# Patient Record
Sex: Male | Born: 1940 | Hispanic: No | Smoking: Former smoker
Health system: Southern US, Community
[De-identification: ages and names within clinical notes are randomized; demographics above are authoritative.]

## PROBLEM LIST (undated history)

## (undated) DIAGNOSIS — R32 Unspecified urinary incontinence: Secondary | ICD-10-CM

## (undated) DIAGNOSIS — I509 Heart failure, unspecified: Secondary | ICD-10-CM

## (undated) DIAGNOSIS — M3214 Glomerular disease in systemic lupus erythematosus: Secondary | ICD-10-CM

## (undated) DIAGNOSIS — F0390 Unspecified dementia without behavioral disturbance: Secondary | ICD-10-CM

## (undated) DIAGNOSIS — E785 Hyperlipidemia, unspecified: Secondary | ICD-10-CM

## (undated) DIAGNOSIS — I10 Essential (primary) hypertension: Secondary | ICD-10-CM

## (undated) DIAGNOSIS — K4090 Unilateral inguinal hernia, without obstruction or gangrene, not specified as recurrent: Secondary | ICD-10-CM

## (undated) DIAGNOSIS — S4990XA Unspecified injury of shoulder and upper arm, unspecified arm, initial encounter: Secondary | ICD-10-CM

## (undated) DIAGNOSIS — D649 Anemia, unspecified: Secondary | ICD-10-CM

## (undated) DIAGNOSIS — J439 Emphysema, unspecified: Secondary | ICD-10-CM

## (undated) DIAGNOSIS — Z7409 Other reduced mobility: Secondary | ICD-10-CM

## (undated) HISTORY — PX: HERNIA REPAIR: SHX51

## (undated) HISTORY — PX: CATARACT EXTRACTION: SUR2

## (undated) HISTORY — DX: Unilateral inguinal hernia, without obstruction or gangrene, not specified as recurrent: K40.90

---

## 1997-09-10 ENCOUNTER — Ambulatory Visit (HOSPITAL_COMMUNITY): Admission: RE | Admit: 1997-09-10 | Discharge: 1997-09-10 | Payer: Self-pay | Admitting: Orthopedic Surgery

## 1997-10-21 ENCOUNTER — Encounter: Admission: RE | Admit: 1997-10-21 | Discharge: 1998-01-19 | Payer: Self-pay | Admitting: Anesthesiology

## 1997-11-24 ENCOUNTER — Inpatient Hospital Stay (HOSPITAL_COMMUNITY): Admission: AD | Admit: 1997-11-24 | Discharge: 1997-11-25 | Payer: Self-pay | Admitting: Internal Medicine

## 1997-11-25 ENCOUNTER — Encounter: Payer: Self-pay | Admitting: Internal Medicine

## 2004-10-05 ENCOUNTER — Ambulatory Visit: Payer: Self-pay | Admitting: Internal Medicine

## 2004-10-13 ENCOUNTER — Ambulatory Visit: Payer: Self-pay | Admitting: Internal Medicine

## 2004-10-24 ENCOUNTER — Ambulatory Visit: Payer: Self-pay | Admitting: Internal Medicine

## 2004-11-01 ENCOUNTER — Ambulatory Visit: Payer: Self-pay

## 2004-11-02 ENCOUNTER — Ambulatory Visit: Payer: Self-pay | Admitting: Internal Medicine

## 2004-11-23 ENCOUNTER — Ambulatory Visit: Payer: Self-pay | Admitting: Internal Medicine

## 2004-11-30 ENCOUNTER — Ambulatory Visit: Payer: Self-pay | Admitting: Cardiology

## 2004-12-06 ENCOUNTER — Ambulatory Visit: Payer: Self-pay

## 2004-12-23 ENCOUNTER — Ambulatory Visit: Payer: Self-pay | Admitting: Cardiology

## 2005-01-05 ENCOUNTER — Ambulatory Visit: Payer: Self-pay | Admitting: Internal Medicine

## 2005-01-06 ENCOUNTER — Ambulatory Visit: Payer: Self-pay | Admitting: Cardiology

## 2005-01-25 ENCOUNTER — Ambulatory Visit: Payer: Self-pay | Admitting: Cardiology

## 2005-02-02 ENCOUNTER — Ambulatory Visit: Payer: Self-pay | Admitting: Internal Medicine

## 2005-02-16 ENCOUNTER — Ambulatory Visit: Payer: Self-pay

## 2005-02-16 ENCOUNTER — Ambulatory Visit: Payer: Self-pay | Admitting: Internal Medicine

## 2005-05-28 ENCOUNTER — Emergency Department (HOSPITAL_COMMUNITY): Admission: EM | Admit: 2005-05-28 | Discharge: 2005-05-28 | Payer: Self-pay | Admitting: Emergency Medicine

## 2005-06-18 ENCOUNTER — Encounter: Admission: RE | Admit: 2005-06-18 | Discharge: 2005-06-18 | Payer: Self-pay | Admitting: Orthopedic Surgery

## 2005-09-05 ENCOUNTER — Ambulatory Visit: Payer: Self-pay | Admitting: Cardiology

## 2006-01-11 ENCOUNTER — Encounter: Payer: Self-pay | Admitting: Cardiology

## 2006-01-11 ENCOUNTER — Ambulatory Visit: Payer: Self-pay | Admitting: Cardiology

## 2006-01-11 ENCOUNTER — Inpatient Hospital Stay (HOSPITAL_COMMUNITY): Admission: EM | Admit: 2006-01-11 | Discharge: 2006-01-11 | Payer: Self-pay | Admitting: Emergency Medicine

## 2006-02-02 ENCOUNTER — Ambulatory Visit: Payer: Self-pay | Admitting: Cardiology

## 2006-03-11 ENCOUNTER — Emergency Department (HOSPITAL_COMMUNITY): Admission: EM | Admit: 2006-03-11 | Discharge: 2006-03-11 | Payer: Self-pay | Admitting: Emergency Medicine

## 2007-02-10 ENCOUNTER — Emergency Department (HOSPITAL_COMMUNITY): Admission: EM | Admit: 2007-02-10 | Discharge: 2007-02-10 | Payer: Self-pay | Admitting: Family Medicine

## 2007-02-12 ENCOUNTER — Emergency Department (HOSPITAL_COMMUNITY): Admission: EM | Admit: 2007-02-12 | Discharge: 2007-02-13 | Payer: Self-pay | Admitting: Emergency Medicine

## 2007-04-06 ENCOUNTER — Encounter: Admission: RE | Admit: 2007-04-06 | Discharge: 2007-04-06 | Payer: Self-pay | Admitting: Otolaryngology

## 2007-12-26 ENCOUNTER — Emergency Department (HOSPITAL_COMMUNITY): Admission: EM | Admit: 2007-12-26 | Discharge: 2007-12-26 | Payer: Self-pay | Admitting: Emergency Medicine

## 2010-08-26 NOTE — H&P (Signed)
NAME:  LARRELL, RAPOZO NO.:  [id number]   MEDICAL RECORD NO.:  [id number]          PATIENT TYPE:  EMS   LOCATION:  MAJO                         FACILITY:  [facility]   PHYSICIAN:  Jonelle Sidle, MD DATE OF BIRTH:  October 25, 1940   DATE OF ADMISSION:  01/10/2006  DATE OF DISCHARGE:                                HISTORY & PHYSICAL   CHIEF COMPLAINT:  Chest pain.   Mr. Sida is a 70 year old Caucasian male with a history of COPD and  emphysema who presents with sharp mid sternal chest pain for the last nine  hours.  The patient has had a history of chest pain dating back to 1994 for  which he was successfully treated for gastroesophageal reflux disease.  12  months ago, the patient underwent nuclear stress test due to dyspnea on  exertion.  The stress test was negative.  The patient denies any chest pain  for the last several years, but has noted circumferential bilateral lower  rib pain for the last 2-3 months.  The mid sternal pain started at  approximately 3 p.m. today and has been constant for the last nine hours.  The pain started at rest after a light meal and has persisted since without  any worsening during exertion.  The patient does note a pleuritic component  describing significant pain with deep breathing.  After presenting to the  emergency room, he received sublingual nitroglycerin which promptly dropped  his blood pressure making him diaphoretic, hypotensive, and hypoxic with  sats in the 80s.  Chest CT was negative for pulmonary embolism.  The patient  also received aspirin and Solu-Medrol and subsequently noted a mild  improvement in his symptoms, but still states that his pain is 8 out of 10.  The patient states that the pain radiates slightly to the right chest.  The  patient denies any orthopnea, paroxysmal nocturnal dyspnea, or syncope.   PAST MEDICAL HISTORY:  Emphysema/asthma, hypercholesterolemia, arthritis,  and chronic low back pain,  restless leg syndrome.   ALLERGIES:  No known drug allergies.   MEDICATIONS:  Advair p.r.n., Simvastatin 20 mg a day, Melatonin p.r.n.,  Gabapentin unknown dose, and Legatrim over the counter medication for  restless leg syndrome.   SOCIAL HISTORY:  The patient lives in [place] with his wife.  He is  retired.  He quit tobacco use 15 years ago.  He denies any alcohol use.   FAMILY HISTORY:  Notable for no significant coronary artery disease or  diabetes.   REVIEW OF SYSTEMS:  Notable for chronic low back pain, mild intermittent  shortness of breath, 12 systems was reviewed and is negative.   PHYSICAL EXAMINATION:  VITAL SIGNS:  Temperature 98.7, pulse 89, respiratory rate 20, blood  pressure 105/62.  GENERAL:  The patient is awake, alert, oriented x3, in no acute distress.  HEENT:  Normocephalic and atraumatic.  Pupils equal, round, reactive to  light.  Extraocular movements intact.  NECK:  No JVD, no carotid bruits.  CARDIOVASCULAR:  Regular rhythm, normal rate, no murmurs, gallops, and rubs.  LUNGS:  Mildly decreased diffuse air movement  but no wheezing or rhonchi.  CHEST:  Chest wall demonstrated no significant reproducible tenderness to  deep palpation at the mid sternum.  ABDOMEN:  Obese, positive bowel sounds, soft, nontender, nondistended.  EXTREMITIES:  1+ distal pulses with no bruits.  NEUROLOGICAL:  Cranial nerves 2-12 grossly intact, no focal motor or sensory  deficits.  5/5 extremity strength.  SKIN:  Demonstrates no rash.  MUSCULOSKELETAL:  Demonstrates lower back tenderness to deep palpation.   EKG demonstrates normal sinus rhythm with a heart rate of 85, left axis  deviation, and no evidence of Q waves or ST segment changes.  Labs  demonstrate hemoglobin 17.3 and hematocrit 51.  BUN 11, creatinine 0.9.  CK  68, MB less than 1, troponin less than 0.05.  CT scan, preliminary results,  demonstrates no acute cardiopulmonary process.   ASSESSMENT/PLAN:  This is a  70 year old Caucasian male with emphysema and  recent negative stress test 6-12 months ago who presents with symptoms  consistent with pleuritic chest pain.  1. Chest pain.  Symptoms are atypical for acute coronary syndrome,      however, the patient will be ruled out for myocardial infarction.  We      will hold anticoagulation unless the patient rules in which is a low      probability.  The patient will receive Toradol for pain relief.  He      will be admitted to telemetry.  2. COPD.  There is no evidence for acute exacerbation with the patient      denying any worsening shortness of breath.  We will provide rapidly      tapering steroids.  3. DVT prophylaxis.  Subcu heparin 5000 units b.i.d.      Reginia Forts, MD   Electronically Signed     ______________________________  Jonelle Sidle, MD    RA/MEDQ  D:  01/11/2006  T:  01/11/2006  Job:  [id number]

## 2010-08-26 NOTE — Discharge Summary (Signed)
NAME:  [name], Charles NO.:  [id number]   MEDICAL RECORD NO.:  [id number]          PATIENT TYPE:  INP   LOCATION:  6529                         FACILITY:  [facility]   PHYSICIAN:  Dorian Pod, ACNP  DATE OF BIRTH:  1940-08-22   DATE OF ADMISSION:  01/10/2006  DATE OF DISCHARGE:  01/11/2006                                 DISCHARGE SUMMARY   DISCHARGE DIAGNOSIS:  Atypical chest pain, negative cardiac markers, EKG  without acute S-T or T-wave changes.   PAST MEDICAL HISTORY:  Past medical history includes:  1. Cardiomyopathy with an improvement to a normal ejection fraction by      echocardiogram in November of 2006.  2. History of remote tobacco use.  Patient states he has quit at this time  3. Restless leg syndrome.  4. Asthma.  5. Hypercholesterolemia.  6. COPD.  7. Arthritis.  8. Chronic back pain.   HOSPITAL COURSE:  [name] is a 70 year old Caucasian gentleman followed  by Dr. Diona Browner.  In regards to his history of cardiomyopathy, which however  has improved to normal by echocardiogram.  Patient presented to [facility]  emergency room this admission for complaint of chest discomfort.  He  described a sharp midsternal chest pain.  Apparently, [name] had a  stress test done within the last year that was negative for ischemia.  Patient received sublingual nitroglycerin and subsequently became nauseated,  hypokalemic, and hypoxic in the 80's.  CT of the chest was negative for  pulmonary embolism.  Patient states no relief with nitroglycerin.  Patient  then received Solu-Medrol IV and Toradol and was then admitted to the  telemetry unit.  Patient could not tell any improvement with the Toradol.  Cardiac markers were negative.  Prednisone was discontinued as it did not  seem to improve patient's discomfort.  Also, in the setting of hyperglycemic  episode with a glucose of 200.  Patient has no known history of diabetes.  On day of discharge, patient  described the discomfort as more of an ache in  the center of his chest.  He noticed it more after eating peanuts and chips.  This patient's cardiac markers have been negative.  Patient's rhythm has  been stable on the monitor and vital signs are stable.  Patient is being  discharged home to followup with Dr. Diona Browner on October 26th at 9:45 a.m.  I have given him a prescription for Protonix 40 mg daily.  He is instructed  to continue his previous medications including his Advair, simvastatin,  gabapentin.  He knows to call our office if the discomfort continues, if  emergency, he is to seek medical assistance at the nearest facility.  At  time of discharge, patient is afebrile, blood pressure 123/62, heart rate  78, respiration 18, patient is setting 97% on 2L, hemoglobin 17.3, potassium  4.2, BUN and creatinine 10 and 0.9.   DURATION OF DISCHARGE ENCOUNTER:  25 minutes.           ______________________________  Dorian Pod, ACNP    MB/MEDQ  D:  01/11/2006  T:  01/11/2006  Job:  [id number]

## 2010-08-26 NOTE — Assessment & Plan Note (Signed)
[facility]                              CARDIOLOGY OFFICE NOTE   DEMYAN, FUGATE                     MRN:          [id number]  DATE:02/02/2006                            DOB:          07-Feb-1941    PRIMARY CARE PHYSICIAN:  Floydene Flock, M.D.   REASON FOR VISIT:  Post hospitalization followup.   HISTORY OF PRESENT ILLNESS:  I saw Mr. Charles [name] during a recent hospital  stay in October. He had presented at that time with fairly atypical chest  pain and ruled out for myocardial infarction.  He had a CT scan of the chest  which did not reveal pulmonary embolus. It was treated briefly with steroids  and nonsteroidals and ultimately placed on a proton pump inhibitor as his  symptoms seemed perhaps more gastrointestinal in etiology.  He did have a  followup echocardiogram demonstrating stable, normal left ventricular  ejection fraction of 55-60% without regional wall motion abnormalities. He  denies any problems with limiting dyspnea on exertion beyond NYHA Class II  and he has had no chest pain, palpitations or syncope.  The EKG today showed  sinus rhythm with a leftward axis and incomplete right bundle branch block  pattern.   ALLERGIES:  No known drug allergies.   PRESENT MEDICATIONS:  1. Advair two puffs daily.  2. Nexium 40 mg p.o. daily.   REVIEW OF SYSTEMS:  As per HPI.   PHYSICAL EXAMINATION:  VITAL SIGNS:  Blood pressure 128/70, heart rate 77,  weight 198 pounds.  GENERAL:  The patient is comfortable and in no acute distress.  NECK:  No elevated jugular venous pressure without bruits.  No thyromegaly  is noted.  LUNGS:  Clear without labored breathing.  CARDIAC:  Regular rate and rhythm without S3 gallop or loud murmur.  EXTREMITIES:  No significant pitting edema.   IMPRESSION/RECOMMENDATIONS:  1. History of cardiomyopathy although with improvement with normal      ejection fraction, recently assessed in October at  55-60%.  Will      continue observation at this point.  Due to financial concerns, he has      preferred little if any medications going forward.  I will see him      back in the next six months.  2. Otherwise continue regular followup __________.     Jonelle Sidle, MD    SGM/MedQ  DD: 02/02/2006  DT: 02/03/2006  Job #: [id number]   cc:   Floydene Flock, M.D.

## 2011-01-09 LAB — POCT CARDIAC MARKERS
CKMB, poc: 1.1
CKMB, poc: <1 — ABNORMAL LOW
Myoglobin, poc: 58.4
Myoglobin, poc: 86.8
Troponin i, poc: 0.06 — ABNORMAL HIGH
Troponin i, poc: <0.05

## 2011-01-09 LAB — POCT I-STAT, CHEM 8
Calcium, Ion: 1.07 — ABNORMAL LOW
Chloride: 107
HCT: 44
Sodium: 139
TCO2: 29

## 2011-01-09 LAB — CK TOTAL AND CKMB (NOT AT ARMC): Relative Index: INVALID

## 2011-01-17 LAB — I-STAT 8, (EC8 V) (CONVERTED LAB)
BUN: 11
Chloride: 109
HCT: 46
Hemoglobin: 15.6
Sodium: 142
TCO2: 29
pCO2, Ven: 50.1 — ABNORMAL HIGH
pH, Ven: 7.353 — ABNORMAL HIGH

## 2011-01-17 LAB — CBC
HCT: 42
Hemoglobin: 13.9
Platelets: 228

## 2011-01-17 LAB — DIFFERENTIAL
Basophils Absolute: 0
Eosinophils Relative: 2
Lymphs Abs: 2.5
Monocytes Absolute: 0.6
Monocytes Relative: 9
Neutro Abs: 3.3

## 2011-01-17 LAB — POCT CARDIAC MARKERS
Operator id: 294341
Troponin i, poc: <0.05

## 2013-11-08 DIAGNOSIS — D649 Anemia, unspecified: Secondary | ICD-10-CM

## 2013-11-08 HISTORY — DX: Anemia, unspecified: D64.9

## 2013-11-08 HISTORY — PX: RENAL BIOPSY, PERCUTANEOUS: SUR144

## 2013-12-10 ENCOUNTER — Emergency Department (HOSPITAL_COMMUNITY): Payer: Medicare Other

## 2013-12-10 ENCOUNTER — Inpatient Hospital Stay (HOSPITAL_COMMUNITY)
Admission: EM | Admit: 2013-12-10 | Discharge: 2013-12-21 | DRG: 682 | Disposition: A | Discharge status: Home Health | Payer: Medicare Other | Attending: Internal Medicine | Admitting: Internal Medicine

## 2013-12-10 ENCOUNTER — Encounter (HOSPITAL_COMMUNITY): Payer: Self-pay | Admitting: Emergency Medicine

## 2013-12-10 DIAGNOSIS — T451X5A Adverse effect of antineoplastic and immunosuppressive drugs, initial encounter: Secondary | ICD-10-CM | POA: Diagnosis present

## 2013-12-10 DIAGNOSIS — IMO0002 Reserved for concepts with insufficient information to code with codable children: Secondary | ICD-10-CM

## 2013-12-10 DIAGNOSIS — N059 Unspecified nephritic syndrome with unspecified morphologic changes: Secondary | ICD-10-CM | POA: Diagnosis present

## 2013-12-10 DIAGNOSIS — E872 Acidosis, unspecified: Secondary | ICD-10-CM | POA: Diagnosis present

## 2013-12-10 DIAGNOSIS — N184 Chronic kidney disease, stage 4 (severe): Secondary | ICD-10-CM

## 2013-12-10 DIAGNOSIS — D631 Anemia in chronic kidney disease: Secondary | ICD-10-CM | POA: Diagnosis not present

## 2013-12-10 DIAGNOSIS — I120 Hypertensive chronic kidney disease with stage 5 chronic kidney disease or end stage renal disease: Secondary | ICD-10-CM | POA: Diagnosis not present

## 2013-12-10 DIAGNOSIS — N049 Nephrotic syndrome with unspecified morphologic changes: Secondary | ICD-10-CM | POA: Diagnosis present

## 2013-12-10 DIAGNOSIS — R195 Other fecal abnormalities: Secondary | ICD-10-CM | POA: Diagnosis present

## 2013-12-10 DIAGNOSIS — N186 End stage renal disease: Secondary | ICD-10-CM | POA: Diagnosis present

## 2013-12-10 DIAGNOSIS — Z87891 Personal history of nicotine dependence: Secondary | ICD-10-CM

## 2013-12-10 DIAGNOSIS — Z806 Family history of leukemia: Secondary | ICD-10-CM

## 2013-12-10 DIAGNOSIS — S32000A Wedge compression fracture of unspecified lumbar vertebra, initial encounter for closed fracture: Secondary | ICD-10-CM

## 2013-12-10 DIAGNOSIS — Z79899 Other long term (current) drug therapy: Secondary | ICD-10-CM

## 2013-12-10 DIAGNOSIS — Z794 Long term (current) use of insulin: Secondary | ICD-10-CM

## 2013-12-10 DIAGNOSIS — E1129 Type 2 diabetes mellitus with other diabetic kidney complication: Secondary | ICD-10-CM | POA: Diagnosis present

## 2013-12-10 DIAGNOSIS — E8779 Other fluid overload: Secondary | ICD-10-CM | POA: Diagnosis not present

## 2013-12-10 DIAGNOSIS — D61818 Other pancytopenia: Secondary | ICD-10-CM

## 2013-12-10 DIAGNOSIS — N039 Chronic nephritic syndrome with unspecified morphologic changes: Secondary | ICD-10-CM

## 2013-12-10 DIAGNOSIS — E1122 Type 2 diabetes mellitus with diabetic chronic kidney disease: Secondary | ICD-10-CM

## 2013-12-10 DIAGNOSIS — R627 Adult failure to thrive: Secondary | ICD-10-CM | POA: Diagnosis present

## 2013-12-10 DIAGNOSIS — E875 Hyperkalemia: Secondary | ICD-10-CM | POA: Diagnosis present

## 2013-12-10 DIAGNOSIS — W010XXA Fall on same level from slipping, tripping and stumbling without subsequent striking against object, initial encounter: Secondary | ICD-10-CM | POA: Diagnosis present

## 2013-12-10 DIAGNOSIS — N189 Chronic kidney disease, unspecified: Secondary | ICD-10-CM

## 2013-12-10 DIAGNOSIS — E785 Hyperlipidemia, unspecified: Secondary | ICD-10-CM | POA: Diagnosis present

## 2013-12-10 DIAGNOSIS — Z801 Family history of malignant neoplasm of trachea, bronchus and lung: Secondary | ICD-10-CM

## 2013-12-10 DIAGNOSIS — S32010A Wedge compression fracture of first lumbar vertebra, initial encounter for closed fracture: Secondary | ICD-10-CM | POA: Diagnosis present

## 2013-12-10 DIAGNOSIS — N058 Unspecified nephritic syndrome with other morphologic changes: Secondary | ICD-10-CM

## 2013-12-10 DIAGNOSIS — D649 Anemia, unspecified: Secondary | ICD-10-CM

## 2013-12-10 DIAGNOSIS — M329 Systemic lupus erythematosus, unspecified: Secondary | ICD-10-CM | POA: Diagnosis present

## 2013-12-10 DIAGNOSIS — D696 Thrombocytopenia, unspecified: Secondary | ICD-10-CM | POA: Insufficient documentation

## 2013-12-10 DIAGNOSIS — I10 Essential (primary) hypertension: Secondary | ICD-10-CM | POA: Diagnosis present

## 2013-12-10 DIAGNOSIS — Z9181 History of falling: Secondary | ICD-10-CM

## 2013-12-10 DIAGNOSIS — S32009A Unspecified fracture of unspecified lumbar vertebra, initial encounter for closed fracture: Secondary | ICD-10-CM

## 2013-12-10 DIAGNOSIS — Z992 Dependence on renal dialysis: Secondary | ICD-10-CM

## 2013-12-10 DIAGNOSIS — J438 Other emphysema: Secondary | ICD-10-CM | POA: Diagnosis present

## 2013-12-10 DIAGNOSIS — N185 Chronic kidney disease, stage 5: Secondary | ICD-10-CM | POA: Diagnosis present

## 2013-12-10 DIAGNOSIS — M3214 Glomerular disease in systemic lupus erythematosus: Secondary | ICD-10-CM | POA: Diagnosis present

## 2013-12-10 DIAGNOSIS — F39 Unspecified mood [affective] disorder: Secondary | ICD-10-CM | POA: Diagnosis present

## 2013-12-10 DIAGNOSIS — E46 Unspecified protein-calorie malnutrition: Secondary | ICD-10-CM | POA: Diagnosis present

## 2013-12-10 HISTORY — DX: Heart failure, unspecified: I50.9

## 2013-12-10 HISTORY — DX: Essential (primary) hypertension: I10

## 2013-12-10 HISTORY — DX: Glomerular disease in systemic lupus erythematosus: M32.14

## 2013-12-10 HISTORY — DX: Anemia, unspecified: D64.9

## 2013-12-10 HISTORY — DX: Hyperlipidemia, unspecified: E78.5

## 2013-12-10 HISTORY — DX: Emphysema, unspecified: J43.9

## 2013-12-10 LAB — URINE MICROSCOPIC-ADD ON

## 2013-12-10 LAB — URINALYSIS, ROUTINE W REFLEX MICROSCOPIC
Bilirubin Urine: NEGATIVE
Glucose, UA: 100 mg/dL — AB
Ketones, ur: NEGATIVE mg/dL
Leukocytes, UA: NEGATIVE
NITRITE: NEGATIVE
Protein, ur: >300 mg/dL — AB
Specific Gravity, Urine: 1.019 (ref 1.005–1.030)
Urobilinogen, UA: 0.2 mg/dL (ref 0.0–1.0)
pH: 5 (ref 5.0–8.0)

## 2013-12-10 LAB — RETICULOCYTES
RBC.: 2.82 MIL/uL — ABNORMAL LOW (ref 4.22–5.81)
Retic Count, Absolute: 11.3 10*3/uL — ABNORMAL LOW (ref 19.0–186.0)
Retic Ct Pct: 0.4 % (ref 0.4–3.1)

## 2013-12-10 LAB — TYPE AND SCREEN
ABO/RH(D): O POS
Antibody Screen: NEGATIVE

## 2013-12-10 LAB — CBC
HEMATOCRIT: 22.7 % — AB (ref 39.0–52.0)
HEMOGLOBIN: 7.4 g/dL — AB (ref 13.0–17.0)
MCH: 27.2 pg (ref 26.0–34.0)
MCHC: 32.6 g/dL (ref 30.0–36.0)
MCV: 83.5 fL (ref 78.0–100.0)
PLATELETS: 77 10*3/uL — AB (ref 150–400)
RBC: 2.72 MIL/uL — AB (ref 4.22–5.81)
RDW: 16.4 % — AB (ref 11.5–15.5)
WBC: 4.5 10*3/uL (ref 4.0–10.5)

## 2013-12-10 LAB — COMPREHENSIVE METABOLIC PANEL
ALK PHOS: 65 U/L (ref 39–117)
ALT: 21 U/L (ref 0–53)
ANION GAP: 17 — AB (ref 5–15)
AST: 14 U/L (ref 0–37)
Albumin: 2.2 g/dL — ABNORMAL LOW (ref 3.5–5.2)
BUN: 95 mg/dL — ABNORMAL HIGH (ref 6–23)
CHLORIDE: 103 meq/L (ref 96–112)
CO2: 16 meq/L — AB (ref 19–32)
Calcium: 7.2 mg/dL — ABNORMAL LOW (ref 8.4–10.5)
Creatinine, Ser: 4.61 mg/dL — ABNORMAL HIGH (ref 0.50–1.35)
GFR calc Af Amer: 13 mL/min — ABNORMAL LOW (ref >90)
GFR, EST NON AFRICAN AMERICAN: 11 mL/min — AB (ref >90)
Glucose, Bld: 209 mg/dL — ABNORMAL HIGH (ref 70–99)
POTASSIUM: 5.4 meq/L — AB (ref 3.7–5.3)
Sodium: 136 mEq/L — ABNORMAL LOW (ref 137–147)
TOTAL PROTEIN: 4.6 g/dL — AB (ref 6.0–8.3)
Total Bilirubin: 0.3 mg/dL (ref 0.3–1.2)

## 2013-12-10 LAB — GLUCOSE, CAPILLARY: Glucose-Capillary: 180 mg/dL — ABNORMAL HIGH (ref 70–99)

## 2013-12-10 LAB — ABO/RH: ABO/RH(D): O POS

## 2013-12-10 LAB — POC OCCULT BLOOD, ED: FECAL OCCULT BLD: POSITIVE — AB

## 2013-12-10 MED ORDER — TIOTROPIUM BROMIDE MONOHYDRATE 18 MCG IN CAPS
18.0000 ug | ORAL_CAPSULE | Freq: Every day | RESPIRATORY_TRACT | Status: DC
  Administered 2013-12-11 – 2013-12-21 (×11): 18 ug via RESPIRATORY_TRACT
  Filled 2013-12-10 (×2): qty 5

## 2013-12-10 MED ORDER — PREDNISONE 10 MG PO TABS
10.0000 mg | ORAL_TABLET | Freq: Every day | ORAL | Status: DC
  Administered 2013-12-11 – 2013-12-12 (×2): 10 mg via ORAL
  Filled 2013-12-10 (×3): qty 1

## 2013-12-10 MED ORDER — PREDNISONE 10 MG PO TABS
10.0000 mg | ORAL_TABLET | Freq: Every day | ORAL | Status: DC
  Filled 2013-12-10: qty 1

## 2013-12-10 MED ORDER — SODIUM CHLORIDE 0.9 % IJ SOLN
3.0000 mL | Freq: Two times a day (BID) | INTRAMUSCULAR | Status: DC
  Administered 2013-12-10 – 2013-12-19 (×15): 3 mL via INTRAVENOUS

## 2013-12-10 MED ORDER — ATENOLOL 25 MG PO TABS
25.0000 mg | ORAL_TABLET | Freq: Every day | ORAL | Status: DC
  Administered 2013-12-11 – 2013-12-20 (×9): 25 mg via ORAL
  Filled 2013-12-10 (×12): qty 1

## 2013-12-10 MED ORDER — ALBUTEROL SULFATE (2.5 MG/3ML) 0.083% IN NEBU
2.5000 mg | INHALATION_SOLUTION | Freq: Three times a day (TID) | RESPIRATORY_TRACT | Status: DC
  Administered 2013-12-11: 2.5 mg via RESPIRATORY_TRACT
  Filled 2013-12-10 (×2): qty 3

## 2013-12-10 MED ORDER — PREDNISONE 20 MG PO TABS
20.0000 mg | ORAL_TABLET | Freq: Two times a day (BID) | ORAL | Status: DC
  Administered 2013-12-10 – 2013-12-13 (×6): 20 mg via ORAL
  Filled 2013-12-10 (×10): qty 1

## 2013-12-10 MED ORDER — HYDROCODONE-ACETAMINOPHEN 5-325 MG PO TABS
1.0000 | ORAL_TABLET | Freq: Four times a day (QID) | ORAL | Status: DC | PRN
  Administered 2013-12-13 – 2013-12-15 (×3): 1 via ORAL
  Filled 2013-12-10 (×3): qty 1

## 2013-12-10 MED ORDER — AMLODIPINE BESYLATE 10 MG PO TABS
10.0000 mg | ORAL_TABLET | Freq: Every day | ORAL | Status: DC
  Administered 2013-12-11 – 2013-12-14 (×4): 10 mg via ORAL
  Filled 2013-12-10 (×4): qty 1

## 2013-12-10 MED ORDER — MYCOPHENOLATE MOFETIL 250 MG PO CAPS
1000.0000 mg | ORAL_CAPSULE | Freq: Two times a day (BID) | ORAL | Status: DC
  Administered 2013-12-10 – 2013-12-11 (×2): 1000 mg via ORAL
  Filled 2013-12-10 (×3): qty 4

## 2013-12-10 MED ORDER — PREDNISONE 20 MG PO TABS: 40.0000 mg | ORAL_TABLET | Freq: Every day | ORAL | Status: DC

## 2013-12-10 MED ORDER — PREDNISONE 50 MG PO TABS
50.0000 mg | ORAL_TABLET | Freq: Every day | ORAL | Status: DC
  Filled 2013-12-10: qty 1

## 2013-12-10 MED ORDER — SODIUM CHLORIDE 0.9 % IV BOLUS (SEPSIS): 500.0000 mL | Freq: Once | INTRAVENOUS | Status: DC

## 2013-12-10 MED ORDER — ALBUTEROL SULFATE HFA 108 (90 BASE) MCG/ACT IN AERS: 2.0000 | INHALATION_SPRAY | Freq: Three times a day (TID) | RESPIRATORY_TRACT | Status: DC

## 2013-12-10 MED ORDER — IPRATROPIUM-ALBUTEROL 0.5-2.5 (3) MG/3ML IN SOLN
3.0000 mL | Freq: Once | RESPIRATORY_TRACT | Status: AC
  Administered 2013-12-10: 3 mL via RESPIRATORY_TRACT
  Filled 2013-12-10: qty 3

## 2013-12-10 MED ORDER — BUDESONIDE-FORMOTEROL FUMARATE 80-4.5 MCG/ACT IN AERO
1.0000 | INHALATION_SPRAY | Freq: Every day | RESPIRATORY_TRACT | Status: DC
  Administered 2013-12-10 – 2013-12-21 (×12): 1 via RESPIRATORY_TRACT
  Filled 2013-12-10: qty 6.9

## 2013-12-10 MED ORDER — INSULIN ASPART 100 UNIT/ML ~~LOC~~ SOLN
0.0000 [IU] | Freq: Three times a day (TID) | SUBCUTANEOUS | Status: DC
  Administered 2013-12-11: 2 [IU] via SUBCUTANEOUS
  Administered 2013-12-12: 1 [IU] via SUBCUTANEOUS
  Administered 2013-12-12: 5 [IU] via SUBCUTANEOUS
  Administered 2013-12-13: 2 [IU] via SUBCUTANEOUS
  Administered 2013-12-13 (×2): 1 [IU] via SUBCUTANEOUS
  Administered 2013-12-14: 3 [IU] via SUBCUTANEOUS
  Administered 2013-12-14: 1 [IU] via SUBCUTANEOUS
  Administered 2013-12-15: 2 [IU] via SUBCUTANEOUS
  Administered 2013-12-16: 3 [IU] via SUBCUTANEOUS
  Administered 2013-12-16 – 2013-12-17 (×2): 2 [IU] via SUBCUTANEOUS
  Administered 2013-12-18: 3 [IU] via SUBCUTANEOUS
  Administered 2013-12-19 – 2013-12-20 (×2): 2 [IU] via SUBCUTANEOUS
  Administered 2013-12-20: 1 [IU] via SUBCUTANEOUS
  Administered 2013-12-21: 2 [IU] via SUBCUTANEOUS

## 2013-12-10 MED ORDER — PANTOPRAZOLE SODIUM 40 MG PO TBEC
40.0000 mg | DELAYED_RELEASE_TABLET | Freq: Every day | ORAL | Status: DC
Start: 2013-12-10 — End: 2013-12-14
  Administered 2013-12-11 – 2013-12-14 (×4): 40 mg via ORAL
  Filled 2013-12-10 (×2): qty 1

## 2013-12-10 NOTE — ED Provider Notes (Signed)
I saw and evaluated the patient, reviewed the resident's note and I agree with the findings and plan.   .Face to face Exam:  General:  Awake HEENT:  Atraumatic Resp:  Normal effort Abd:  Nondistended Neuro:No focal weakness  Nelia Shi, MD 12/10/13 1943

## 2013-12-10 NOTE — H&P (Signed)
[facility] History and Physical  Charles [name]:[id number] DOB: 12/26/1940 DOA: 12/10/2013  Referring physician: EDP PCP: Aida Puffer, MD   Chief Complaint: Low HGB   HPI: Charles [name] is a 73 y.o. 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  He was started on Prednisone and celcept and told to follow up with nephrology "with in a week".  Unfortunately nearly a month later he hasnt been able to get in to see nephrology although he did successfully see a rheumatologist over at [facility] whom family states noted that his kidney function continued to look bad but was "stable" since his [facility] discharge at that time.  [facility] records are unfortunately unavailable here at this time.  He was sent in to the ED today by his PCP after lab work showed his HGB to be low at 7.5, this was confirmed today in ED at 7.4.  Patient also had a fall recently and has been having back pain since then.  Review of Systems: No melena, BRBPR, nor emesis.  Systems reviewed.  As above, otherwise negative  Past Medical History  Diagnosis Date  . Hypertension   . Hyperlipidemia   . Emphysema lung   . Heart failure   . Stage IV lupus nephritis (WHO)    Past Surgical History  Procedure Laterality Date  . Eye surgery     Social History:  reports that he has quit smoking. He has quit using smokeless tobacco. He reports that he does not drink alcohol or use illicit drugs.  No Known Allergies  History reviewed. No pertinent family history.   Prior to Admission medications   Medication Sig Start Date End Date Taking? Authorizing Provider  albuterol (PROVENTIL HFA;VENTOLIN HFA) 108 (90 BASE) MCG/ACT inhaler Inhale 2 puffs into the lungs 3 (three) times daily.   Yes Historical Provider, MD  amLODipine (NORVASC) 10 MG tablet Take 10 mg by mouth daily.   Yes Historical Provider, MD  atenolol (TENORMIN) 25 MG tablet Take 25  mg by mouth daily.   Yes Historical Provider, MD  budesonide-formoterol (SYMBICORT) 80-4.5 MCG/ACT inhaler Inhale 1 puff into the lungs daily.   Yes Historical Provider, MD  insulin aspart (NOVOLOG FLEXPEN) 100 UNIT/ML FlexPen Inject 5 Units into the skin 3 (three) times daily with meals. As needed for glucose more then 150   Yes Historical Provider, MD  MELATONIN PO Take 1 tablet by mouth at bedtime as needed (sleep).   Yes Historical Provider, MD  mycophenolate (CELLCEPT) 500 MG tablet Take 1,000 mg by mouth 2 (two) times daily.   Yes Historical Provider, MD  omeprazole (PRILOSEC) 20 MG capsule Take 20 mg by mouth daily.   Yes Historical Provider, MD  predniSONE (DELTASONE) 10 MG tablet Take 10 mg by mouth daily with breakfast. Take along with 40 mg for a total of 50 mg daily   Yes Historical Provider, MD  predniSONE (DELTASONE) 20 MG tablet Take 40 mg by mouth daily. Take along with 10 mg for a total of 50 mg   Yes Historical Provider, MD  tiotropium (SPIRIVA) 18 MCG inhalation capsule Place 18 mcg into inhaler and inhale daily.   Yes Historical Provider, MD   Physical Exam: Filed Vitals:   12/10/13 2022  BP: 137/60  Pulse: 75  Temp:   Resp: 20    BP 137/60  Pulse 75  Temp(Src) 98.1 F (36.7 C) (Oral)  Resp 20  SpO2 98%  General Appearance:    Alert, oriented, no distress, appears stated age  Head:    Normocephalic, atraumatic  Eyes:    PERRL, EOMI, sclera non-icteric        Nose:   Nares without drainage or epistaxis. Mucosa, turbinates normal  Throat:   Moist mucous membranes. Oropharynx without erythema or exudate.  Neck:   Supple. No carotid bruits.  No thyromegaly.  No lymphadenopathy.   Back:     No CVA tenderness, no spinal tenderness  Lungs:     Wheezy with increased WOB which patient and family state is baseline.  Chest wall:    No tenderness to palpitation  Heart:    Regular rate and rhythm without murmurs, gallops, rubs  Abdomen:     Soft, non-tender, nondistended,  normal bowel sounds, no organomegaly  Genitalia:    deferred  Rectal:    deferred  Extremities:   No clubbing, cyanosis or edema.  Pulses:   2+ and symmetric all extremities  Skin:   Skin color, texture, turgor normal, no rashes or lesions  Lymph nodes:   Cervical, supraclavicular, and axillary nodes normal  Neurologic:   CNII-XII intact. Normal strength, sensation and reflexes      throughout    Labs on Admission:  Basic Metabolic Panel:  Recent Labs Lab 12/10/13 1510  NA 136*  K 5.4*  CL 103  CO2 16*  GLUCOSE 209*  BUN 95*  CREATININE 4.61*  CALCIUM 7.2*   Liver Function Tests:  Recent Labs Lab 12/10/13 1510  AST 14  ALT 21  ALKPHOS 65  BILITOT 0.3  PROT 4.6*  ALBUMIN 2.2*   No results found for this basename: LIPASE, AMYLASE,  in the last 168 hours No results found for this basename: AMMONIA,  in the last 168 hours CBC:  Recent Labs Lab 12/10/13 1510  WBC 4.5  HGB 7.4*  HCT 22.7*  MCV 83.5  PLT 77*   Cardiac Enzymes: No results found for this basename: CKTOTAL, CKMB, CKMBINDEX, TROPONINI,  in the last 168 hours  BNP (last 3 results) No results found for this basename: PROBNP,  in the last 8760 hours CBG: No results found for this basename: GLUCAP,  in the last 168 hours  Radiological Exams on Admission: Dg Lumbar Spine Complete  12/10/2013   CLINICAL DATA:  Fall.  EXAM: LUMBAR SPINE - COMPLETE 4+ VIEW  COMPARISON:  MRI lumbar spine 06/18/2005.  FINDINGS: Paraspinal soft tissues are normal. Degenerative changes thoracolumbar spine. Diffuse osteopenia. No evidence of malalignment. Minimal L1 compression. This is new. No retropulsed fragments. Aortoiliac atherosclerotic vascular disease.  IMPRESSION: 1. Minimal L1 compression. No retropulsed fragments. This is a new finding from prior MRI of 06/18/2005. 2. Diffuse osteopenia and degenerative change. Normal bony alignment. 3. Aortoiliac atherosclerotic vascular disease.   Electronically Signed   By: Maisie Fus   Register   On: 12/10/2013 19:36    EKG: Independently reviewed.  Assessment/Plan Principal Problem:   Anemia of chronic kidney failure Active Problems:   Stage IV lupus nephritis (WHO)   CKD (chronic kidney disease) stage 5, GFR less than 15 ml/min   Occult blood positive stool   Thrombocytopenia   Compression fracture of L1 lumbar vertebra   1. Anemia of CKD secondary to CKD stage 5 from stage IV lupus nephritis - I spoke with Dr. Marisue Humble, nephrology will consult on patient in AM, no recommendations at present. 1. Likely needs erythropoietin. 2. Suspect they will ask for vascular referral for fistula  creation 3. Patient may also need bicarbonate as well. 4. Have put patient on renal diet, potassium 5.4 this evening, recheck BMP in AM. 5. Have sent for outside records from [facility], despite very poor appearing kidney function today it sounds like this is baseline from what family is describing at bedside. 2. Occult blood positive stool - no stigmata of major GI bleed on history, needs GI consult for follow up likely colonoscopy. 3. Compression fracture of L1 - norco PRN pain   Code Status: Full Code  Family Communication: Family at bedside Disposition Plan: Admit to obs   Time spent: 70 min  Rashell Shambaugh M. [facility] Pager [phone]  If 7AM-7PM, please contact the day team taking care of the patient [email or web address] Password [email or web address] 12/10/2013, 8:49 PM

## 2013-12-10 NOTE — ED Notes (Signed)
Pt to xray via stretcher with transport

## 2013-12-10 NOTE — ED Notes (Signed)
The patient went to see Dr. Clarene Duke for a follow up and his hemoglobin came back low at 7.5.  Dr. Clarene Duke told the family to bring him to the ED.  The patient's grandson's girlfriend says he has also had weakness and some AMS however he is GCS-15 for me today.  He does urinate but not sure if it is clear and yellow.  He is complaining of lower back pain in the center.

## 2013-12-10 NOTE — ED Provider Notes (Signed)
CSN: [id number]     Arrival date & time 12/10/13  1428 History   First MD Initiated Contact with Patient 12/10/13 1753     Chief Complaint  Patient presents with  . Abnormal Lab    The patient went to see Dr. Clarene Duke for a follow up and his hemoglobin came back low at 7.5.  Dr. Clarene Duke told the family to bring him to the ED.  . Back Pain     (Consider location/radiation/quality/duration/timing/severity/associated sxs/prior Treatment) HPI Comments: She reports being sent to ED for evaluation after her blood work at PCP showing anemia. He reports having blood transfusion on 8/3 at outside hospital. He is brought in by his son who reports that he was previously living in [place],  [place] where he was recently diagnosed with chronic kidney disease due to lupus per renal biopsy.  Do to this diagnosis and multiple falls at home his son decided to transfer him to [place] to live with him.  He has established care with a primary care physician Dr. Clarene Duke in [place], as well as a rheumatologist, and with forced; however, he has not been seen by nephrologist (referral to [facility] made by PCP).  He denies any acute symptoms; however does admit to ongoing shortness of breath (has COPD on albuterol and Spiriva) with possible CHF diagnosis per son.  He has bilateral lower extremity edema which has been present prior to moving here August 10. Recently treated at [facility] however family does not have records.  He reports falling Sunday and landing on his buttock and then fell backwards hitting his head.  He denies any loss of consciousness, but does report lower back and neck pain since the fall.  He is not on any blood thinners or aspirin. Son reports he has been somewhat confused and agitated; prednisone recently decreased from 60 mg to 50 mg daily by rheumatologist.  Additionally, he is on CellCept for lupus.  He denies any blood in his stool or melena however he does endorse frequent BMs and has  stopped his stool softners due to this.  Denies any current chest pain or previous MIs.  His son reports that he takes Norvasc, atenolol, omeprazole, Lipitor, and possibly Pramipexole?         Past Medical History  Diagnosis Date  . Hypertension   . Hyperlipidemia   . Emphysema lung   . Heart failure   . Stage IV lupus nephritis (WHO)    Past Surgical History  Procedure Laterality Date  . Eye surgery     History reviewed. No pertinent family history. History  Substance Use Topics  . Smoking status: Former Games developer  . Smokeless tobacco: Former Neurosurgeon  . Alcohol Use: No    Review of Systems  Constitutional: Negative for fever and chills.  Respiratory: Positive for wheezing. Negative for chest tightness and shortness of breath.   Cardiovascular: Positive for leg swelling. Negative for chest pain and palpitations.  Gastrointestinal: Positive for diarrhea. Negative for nausea, vomiting, abdominal pain, constipation, blood in stool and anal bleeding.  Genitourinary: Negative for dysuria, hematuria and flank pain.  Musculoskeletal: Positive for back pain and neck pain.  Neurological: Negative for dizziness, speech difficulty, weakness and headaches.  Hematological: Bruises/bleeds easily.    Allergies  Review of patient's allergies indicates no known allergies.  Home Medications   Prior to Admission medications   Not on File   BP 114/50  Pulse 70  Temp(Src) 98.1 F (36.7 C) (Oral)  Resp 19  SpO2 98% Physical Exam  Constitutional: He is oriented to person, place, and time. No distress.  Frail appear elderly male   HENT:  Head: Normocephalic and atraumatic.  Mouth/Throat: Oropharynx is clear and moist.  Eyes: EOM are normal. Pupils are equal, round, and reactive to light.  Neck: Normal range of motion. No JVD present.  No cervical spine tenderness   Cardiovascular: Normal rate, regular rhythm and intact distal pulses.   No murmur heard. 3+ LE pitting edema   Pulmonary/Chest: No respiratory distress. He has wheezes. He has no rales.  Severely decreased breath sounds b/l  Abdominal: Soft. There is no tenderness. There is no rebound.  Genitourinary: Guaiac positive stool.  Neurological: He is alert and oriented to person, place, and time. No cranial nerve deficit.  Skin: Skin is warm. He is not diaphoretic. There is pallor.    ED Course  Procedures (including critical care time) Labs Review Labs Reviewed  URINALYSIS, ROUTINE W REFLEX MICROSCOPIC - Abnormal; Notable for the following:    Glucose, UA 100 (*)    Hgb urine dipstick MODERATE (*)    Protein, ur >300 (*)    All other components within normal limits  CBC - Abnormal; Notable for the following:    RBC 2.72 (*)    Hemoglobin 7.4 (*)    HCT 22.7 (*)    RDW 16.4 (*)    Platelets 77 (*)    All other components within normal limits  COMPREHENSIVE METABOLIC PANEL - Abnormal; Notable for the following:    Sodium 136 (*)    Potassium 5.4 (*)    CO2 16 (*)    Glucose, Bld 209 (*)    BUN 95 (*)    Creatinine, Ser 4.61 (*)    Calcium 7.2 (*)    Total Protein 4.6 (*)    Albumin 2.2 (*)    GFR calc non Af Amer 11 (*)    GFR calc Af Amer 13 (*)    Anion gap 17 (*)    All other components within normal limits  URINE MICROSCOPIC-ADD ON - Abnormal; Notable for the following:    Squamous Epithelial / LPF FEW (*)    Bacteria, UA FEW (*)    Casts GRANULAR CAST (*)    All other components within normal limits  POC OCCULT BLOOD, ED - Abnormal; Notable for the following:    Fecal Occult Bld POSITIVE (*)    All other components within normal limits  OCCULT BLOOD X 1 CARD TO LAB, STOOL  TYPE AND SCREEN    Imaging Review No results found.   EKG Interpretation   Date/Time:  Wednesday December 10 2013 18:21:53 EDT Ventricular Rate:  73 PR Interval:  173 QRS Duration: 104 QT Interval:  406 QTC Calculation: 447 R Axis:   -21 Text Interpretation:  Sinus rhythm Borderline left axis  deviation Abnormal  R-wave progression, early transition Nonspecific T abnrm, anterolateral  leads Minimal ST elevation, anterolateral leads Baseline wander in lead(s)  V2 Abnormal ekg Confirmed by BEATON  MD, ROBERT ([id number]) on 12/10/2013  6:28:10 PM      MDM   Final diagnoses:  None   Anemia: Likely multifactorial with chronic kidney disease (Lupus per Son), possible GI bleed (FOBT positive) with Hgb 7.4 s/p recent blood transfusion. Vital signs stable.  Mild acidosis and hyperkalemia likely due to CKD.  - Admit for evaluation of possible GI bleed - Consult nephrology    Jamal Collin, MD 12/10/13 [phone]

## 2013-12-11 ENCOUNTER — Encounter (HOSPITAL_COMMUNITY): Payer: Self-pay | Admitting: *Deleted

## 2013-12-11 ENCOUNTER — Encounter: Payer: Self-pay | Admitting: Nurse Practitioner

## 2013-12-11 ENCOUNTER — Observation Stay (HOSPITAL_COMMUNITY): Payer: Medicare Other

## 2013-12-11 DIAGNOSIS — N184 Chronic kidney disease, stage 4 (severe): Secondary | ICD-10-CM

## 2013-12-11 DIAGNOSIS — D649 Anemia, unspecified: Secondary | ICD-10-CM

## 2013-12-11 DIAGNOSIS — N189 Chronic kidney disease, unspecified: Secondary | ICD-10-CM

## 2013-12-11 DIAGNOSIS — D631 Anemia in chronic kidney disease: Secondary | ICD-10-CM

## 2013-12-11 DIAGNOSIS — N039 Chronic nephritic syndrome with unspecified morphologic changes: Secondary | ICD-10-CM

## 2013-12-11 LAB — CBC
HEMATOCRIT: 21.3 % — AB (ref 39.0–52.0)
Hemoglobin: 7 g/dL — ABNORMAL LOW (ref 13.0–17.0)
MCH: 27.8 pg (ref 26.0–34.0)
MCHC: 32.9 g/dL (ref 30.0–36.0)
MCV: 84.5 fL (ref 78.0–100.0)
Platelets: 65 10*3/uL — ABNORMAL LOW (ref 150–400)
RBC: 2.52 MIL/uL — ABNORMAL LOW (ref 4.22–5.81)
RDW: 16.4 % — ABNORMAL HIGH (ref 11.5–15.5)
WBC: 3.3 10*3/uL — ABNORMAL LOW (ref 4.0–10.5)

## 2013-12-11 LAB — BASIC METABOLIC PANEL
Anion gap: 16 — ABNORMAL HIGH (ref 5–15)
BUN: 95 mg/dL — AB (ref 6–23)
CALCIUM: 7 mg/dL — AB (ref 8.4–10.5)
CO2: 17 mEq/L — ABNORMAL LOW (ref 19–32)
Chloride: 105 mEq/L (ref 96–112)
Creatinine, Ser: 4.76 mg/dL — ABNORMAL HIGH (ref 0.50–1.35)
GFR calc Af Amer: 13 mL/min — ABNORMAL LOW (ref >90)
GFR, EST NON AFRICAN AMERICAN: 11 mL/min — AB (ref >90)
Glucose, Bld: 109 mg/dL — ABNORMAL HIGH (ref 70–99)
Potassium: 5.7 mEq/L — ABNORMAL HIGH (ref 3.7–5.3)
SODIUM: 138 meq/L (ref 137–147)

## 2013-12-11 LAB — GLUCOSE, CAPILLARY
Glucose-Capillary: 115 mg/dL — ABNORMAL HIGH (ref 70–99)
Glucose-Capillary: 189 mg/dL — ABNORMAL HIGH (ref 70–99)
Glucose-Capillary: 212 mg/dL — ABNORMAL HIGH (ref 70–99)
Glucose-Capillary: 191 mg/dL — ABNORMAL HIGH (ref 70–99)

## 2013-12-11 LAB — RETICULOCYTES
RBC.: 2.62 MIL/uL — ABNORMAL LOW (ref 4.22–5.81)
RETIC COUNT ABSOLUTE: 13.1 10*3/uL — AB (ref 19.0–186.0)
RETIC CT PCT: 0.5 % (ref 0.4–3.1)

## 2013-12-11 LAB — C3 COMPLEMENT: C3 COMPLEMENT: 85 mg/dL — AB (ref 90–180)

## 2013-12-11 LAB — PROTIME-INR
INR: 1.25 (ref 0.00–1.49)
PROTHROMBIN TIME: 15.7 s — AB (ref 11.6–15.2)

## 2013-12-11 MED ORDER — STERILE WATER FOR INJECTION IV SOLN
150.0000 meq | INTRAVENOUS | Status: DC
  Administered 2013-12-11: 150 meq via INTRAVENOUS
  Filled 2013-12-11 (×2): qty 850

## 2013-12-11 MED ORDER — MYCOPHENOLATE MOFETIL 250 MG PO CAPS
500.0000 mg | ORAL_CAPSULE | Freq: Two times a day (BID) | ORAL | Status: DC
  Administered 2013-12-11 – 2013-12-13 (×4): 500 mg via ORAL
  Filled 2013-12-11 (×5): qty 2

## 2013-12-11 MED ORDER — SODIUM BICARBONATE 8.4 % IV SOLN
150.0000 meq | INTRAVENOUS | Status: AC
  Filled 2013-12-11: qty 850

## 2013-12-11 MED ORDER — FUROSEMIDE 10 MG/ML IJ SOLN
80.0000 mg | Freq: Two times a day (BID) | INTRAMUSCULAR | Status: DC
  Administered 2013-12-12: 80 mg via INTRAVENOUS
  Filled 2013-12-11 (×3): qty 8

## 2013-12-11 MED ORDER — ALBUTEROL SULFATE (2.5 MG/3ML) 0.083% IN NEBU
2.5000 mg | INHALATION_SOLUTION | Freq: Every evening | RESPIRATORY_TRACT | Status: DC
  Administered 2013-12-12 – 2013-12-20 (×9): 2.5 mg via RESPIRATORY_TRACT
  Filled 2013-12-11 (×11): qty 3

## 2013-12-11 MED ORDER — SODIUM POLYSTYRENE SULFONATE 15 GM/60ML PO SUSP
15.0000 g | Freq: Once | ORAL | Status: AC
  Administered 2013-12-11: 15 g via ORAL
  Filled 2013-12-11: qty 60

## 2013-12-11 MED ORDER — FUROSEMIDE 10 MG/ML IJ SOLN
80.0000 mg | Freq: Two times a day (BID) | INTRAMUSCULAR | Status: DC
  Administered 2013-12-11: 80 mg via INTRAVENOUS
  Filled 2013-12-11: qty 8

## 2013-12-11 MED ORDER — ALBUTEROL SULFATE (2.5 MG/3ML) 0.083% IN NEBU
2.5000 mg | INHALATION_SOLUTION | Freq: Once | RESPIRATORY_TRACT | Status: AC
  Administered 2013-12-11: 2.5 mg via RESPIRATORY_TRACT

## 2013-12-11 NOTE — Progress Notes (Signed)
UR Completed.  Charles [name] [phone] 12/11/2013  

## 2013-12-11 NOTE — Evaluation (Signed)
Physical Therapy Evaluation Patient Details Name: Charles [name] MRN: [id number] DOB: 03/28/1941 Today's Date: 12/11/2013   History of Present Illness  Pt admit with anemia.    Clinical Impression  Pt admitted with above. Pt currently with functional limitations due to the deficits listed below (see PT Problem List). Has supportive family and CNA at home.  Should be ok to d/c home with HHPT.  Pt will benefit from skilled PT to increase their independence and safety with mobility to allow discharge to the venue listed below.     Follow Up Recommendations Home health PT;Supervision/Assistance - 24 hour    Equipment Recommendations  None recommended by PT    Recommendations for Other Services       Precautions / Restrictions Precautions Precautions: Fall Restrictions Weight Bearing Restrictions: No      Mobility  Bed Mobility                  Transfers Overall transfer level: Needs assistance Equipment used: Rolling walker (2 wheeled) Transfers: Sit to/from Stand Sit to Stand: Mod assist         General transfer comment: Needed cues for hand placement.  Pt needed mod assist for power up as well.    Ambulation/Gait Ambulation/Gait assistance: Min assist Ambulation Distance (Feet): 280 Feet Assistive device: Rolling walker (2 wheeled) Gait Pattern/deviations: Step-through pattern;Decreased stride length;Trunk flexed   Gait velocity interpretation: Below normal speed for age/gender General Gait Details: Pt with flexed posture needing cues to stand tall.  Pt overall steady with RW with ambulation.    Stairs            Wheelchair Mobility    Modified Rankin (Stroke Patients Only)       Balance Overall balance assessment: Needs assistance         Standing balance support: Bilateral upper extremity supported;During functional activity Standing balance-Leahy Scale: Poor Standing balance comment: requires UE support to maintain static and dynamic  balance.                              Pertinent Vitals/Pain Pain Assessment: No/denies pain VSS    Home Living Family/patient expects to be discharged to:: Private residence Living Arrangements: Other relatives Available Help at Discharge: Family;Skilled Nursing Facility;Personal care attendant (CNA 5x/week for 2 hours) Type of Home: House Home Access: Stairs to enter   [employer] of Steps: 1 Home Layout: One level Home Equipment: Environmental consultant - 2 wheels;Bedside commode;Shower seat      Prior Function Level of Independence: Independent with assistive device(s)               Hand Dominance        Extremity/Trunk Assessment   Upper Extremity Assessment: Defer to OT evaluation           Lower Extremity Assessment: Generalized weakness         Communication   Communication: No difficulties  Cognition Arousal/Alertness: Awake/alert Behavior During Therapy: WFL for tasks assessed/performed Overall Cognitive Status: Within Functional Limits for tasks assessed                      General Comments      Exercises General Exercises - Lower Extremity Ankle Circles/Pumps: AROM;Both;10 reps;Supine Long Arc Quad: AROM;Both;5 reps;Seated Hip Flexion/Marching: AROM;Both;5 reps;Seated      Assessment/Plan    PT Assessment Patient needs continued PT services  PT Diagnosis Generalized weakness  PT Problem List Decreased activity tolerance;Decreased balance;Decreased mobility;Decreased knowledge of use of DME;Decreased safety awareness;Decreased knowledge of precautions  PT Treatment Interventions DME instruction;Gait training;Functional mobility training;Therapeutic activities;Therapeutic exercise;Balance training;Patient/family education   PT Goals (Current goals can be found in the Care Plan section) Acute Rehab PT Goals Patient Stated Goal: to go home PT Goal Formulation: With patient Time For Goal Achievement: 12/18/13 Potential  to Achieve Goals: Good    Frequency Min 3X/week   Barriers to discharge        Co-evaluation               End of Session Equipment Utilized During Treatment: Gait belt Activity Tolerance: Patient limited by fatigue Patient left: in chair;with call bell/phone within reach;with chair alarm set Nurse Communication: Mobility status    Functional Assessment Tool Used: clinical judgment Functional Limitation: Mobility: Walking and moving around Mobility: Walking and Moving Around Current Status [phone]): At least 20 percent but less than 40 percent impaired, limited or restricted Mobility: Walking and Moving Around Goal Status [phone]): At least 1 percent but less than 20 percent impaired, limited or restricted    Time: 1151-1206 PT Time Calculation (min): 15 min   Charges:   PT Evaluation $Initial PT Evaluation Tier I: 1 Procedure PT Treatments $Gait Training: 8-22 mins   PT G Codes:   Functional Assessment Tool Used: clinical judgment Functional Limitation: Mobility: Walking and moving around    INGOLD,Deondre Marinaro 12/11/2013, 2:19 PM  [employer] [employer] [phone] (pager)

## 2013-12-11 NOTE — Consult Note (Signed)
She was seen and examined.  X-rays were reviewed.  Full note to follow.  Patient has a severe anemia is probably multifactorial.  He states that he's had intermittent small amounts of rectal bleeding for years.  He apparently had a polyp removed over 5 years ago.  He does not have any acute GI complaints.  He admits to heavy alcohol use in the past.  Impression #1 anemia-multifactorial #2 limited rectal bleeding and history of colon polyp #3 thrombocytopenia, hypoalbuminemia, history of heavy alcohol use.  I suspect that he may have cirrhosis #4 stage IV lupus nephritis  Medications #1 iron supplementation #2 check INR #3 CT of the abdomen to evaluate for cirrhosis #4 colonoscopy; this can be done as an outpatient.  Signing off.  He will be given a appointment to be seen in the office

## 2013-12-11 NOTE — Progress Notes (Signed)
PROGRESS NOTE  Charles [name]:[id number] DOB: [birth year] DOA: 12/10/2013 PCP: Aida Puffer, MD  Assessment/Plan: Anemia of CKD secondary to CKD stage 5 from stage IV lupus nephritis - nephrology will consult on patient, no recommendations at present.  1. Likely needs erythropoietin. 2. Suspect they will ask for vascular referral for fistula creation 3. Patient may also need bicarbonate as well. 4. Have put patient on renal diet, potassium 5.4 this evening, still elevated- give kayexelate. 5. Have sent for outside records from [facility].   Occult blood positive stool - patient reports blood in stool- BRB, GI consult for follow up likely colonoscopy  Compression fracture of L1 - norco PRN pain  Dementia vs uremia?  Code Status: full Family Communication: no family Disposition Plan:    Consultants:  GI  renal  Procedures:       HPI/Subjective: Wants to go home  Objective: Filed Vitals:   12/11/13 0942  BP:   Pulse: 73  Temp:   Resp: 20    Intake/Output Summary (Last 24 hours) at 12/11/13 1033 Last data filed at 12/11/13 0952  Gross per 24 hour  Intake    220 ml  Output      0 ml  Net    220 ml   Filed Weights   12/10/13 2100  Weight: 89.268 kg (196 lb 12.8 oz)    Exam:   General:  Pleasant/cooeprative  Cardiovascular: rrr  Respiratory: clear  Abdomen: +Bs, soft  Musculoskeletal: +edema in LE and left arm   Data Reviewed: Basic Metabolic Panel:  Recent Labs Lab 12/10/13 1510 12/11/13 0431  NA 136* 138  K 5.4* 5.7*  CL 103 105  CO2 16* 17*  GLUCOSE 209* 109*  BUN 95* 95*  CREATININE 4.61* 4.76*  CALCIUM 7.2* 7.0*   Liver Function Tests:  Recent Labs Lab 12/10/13 1510  AST 14  ALT 21  ALKPHOS 65  BILITOT 0.3  PROT 4.6*  ALBUMIN 2.2*   No results found for this basename: LIPASE, AMYLASE,  in the last 168 hours No results found for this basename: AMMONIA,  in the last 168 hours CBC:  Recent Labs Lab 12/10/13 1510  12/11/13 0431  WBC 4.5 3.3*  HGB 7.4* 7.0*  HCT 22.7* 21.3*  MCV 83.5 84.5  PLT 77* 65*   Cardiac Enzymes: No results found for this basename: CKTOTAL, CKMB, CKMBINDEX, TROPONINI,  in the last 168 hours BNP (last 3 results) No results found for this basename: PROBNP,  in the last 8760 hours CBG:  Recent Labs Lab 12/10/13 2132 12/11/13 0639  GLUCAP 180* 115*    No results found for this or any previous visit (from the past 240 hour(s)).   Studies: Dg Lumbar Spine Complete  12/10/2013   CLINICAL DATA:  Fall.  EXAM: LUMBAR SPINE - COMPLETE 4+ VIEW  COMPARISON:  MRI lumbar spine 06/18/2005.  FINDINGS: Paraspinal soft tissues are normal. Degenerative changes thoracolumbar spine. Diffuse osteopenia. No evidence of malalignment. Minimal L1 compression. This is new. No retropulsed fragments. Aortoiliac atherosclerotic vascular disease.  IMPRESSION: 1. Minimal L1 compression. No retropulsed fragments. This is a new finding from prior MRI of 06/18/2005. 2. Diffuse osteopenia and degenerative change. Normal bony alignment. 3. Aortoiliac atherosclerotic vascular disease.   Electronically Signed   By: Maisie Fus  Register   On: 12/10/2013 19:36    Scheduled Meds: . albuterol  2.5 mg Nebulization TID  . amLODipine  10 mg Oral Daily  . atenolol  25 mg Oral Daily  . budesonide-formoterol  1 puff Inhalation Daily  . insulin aspart  0-9 Units Subcutaneous TID WC  . mycophenolate  1,000 mg Oral BID  . pantoprazole  40 mg Oral Daily  . predniSONE  10 mg Oral Q lunch  . predniSONE  20 mg Oral BID WC  . sodium chloride  3 mL Intravenous Q12H  . tiotropium  18 mcg Inhalation Daily   Continuous Infusions:  Antibiotics Given (last 72 hours)   None      Principal Problem:   Anemia of chronic kidney failure Active Problems:   Stage IV lupus nephritis (WHO)   CKD (chronic kidney disease) stage 5, GFR less than 15 ml/min   Occult blood positive stool   Thrombocytopenia   Compression fracture of  L1 lumbar vertebra    Time spent: 35 min    [name], [name]  [facility] Pager [phone]. If 7PM-7AM, please contact night-coverage at [email or web address], password [facility] 12/11/2013, 10:33 AM  LOS: 1 day

## 2013-12-11 NOTE — Progress Notes (Signed)
OT Cancellation Note  Patient Details Name: Charles [name] MRN: [id number] DOB: Nov 01, 1940   Cancelled Treatment:    Reason Eval/Treat Not Completed: Patient at procedure or test/ unavailable. Pt off floor for CT. OT will follow up to complete evaluation as available.   Rae Lips [phone] 12/11/2013, 5:14 PM

## 2013-12-11 NOTE — Care Management Note (Addendum)
    Page 1 of 2   12/22/2013     11:59:00 AM CARE MANAGEMENT NOTE 12/22/2013  Patient:  Charles [name], Charles [name]   Account Number:  [id number]  Date Initiated:  12/11/2013  Documentation initiated by:  Jorryn Casagrande  Subjective/Objective Assessment:   Pt adm on 12/10/13 with anemia of Chronic Kidney Disease.  Pt independent prior to admission.     Action/Plan:   Will follow for dc needs as pt progresses.   Anticipated DC Date:  12/22/2013   Anticipated DC Plan:  LONG TERM ACUTE CARE (LTAC)      DC Planning Services  CM consult      [facility] Choice  HOME HEALTH   Choice offered to / List presented to:  C-2 HC POA / Guardian        HH arranged  HH-1 RN  HH-2 PT  HH-3 OT  HH-6 SOCIAL WORKER      HH agency  [facility]   Status of service:  Completed, signed off Medicare Important Message given?  YES (If response is "NO", the following Medicare IM given date fields will be blank) Date Medicare IM given:  12/15/2013 Medicare IM given by:  [facility] Date Additional Medicare IM given:  12/19/2013 Additional Medicare IM given by:  Verdis Prime  Discharge Disposition:  HOME [facility] SERVICES  Per UR Regulation:  Reviewed for med. necessity/level of care/duration of stay  If discussed at Long Length of Stay Meetings, dates discussed:   12/18/2013    Comments:  12/21/13 19:00 CM received call from MD for discharge needs for pt.  Pt being discharged home in the care of [name] [phone] to [address] [place], [place].  Apolinar Junes chooses [facility] to render HHPT/OT/RN/SW.  Referral called to [facility] rep, Dona.  No other CM needs were communicated.  Freddy Jaksch, BSN, Cm [phone].    12/18/13 [name], RN, BSN [phone] Pt being considered for LTAC admission, however at this time with pt receiving daily dialysis, LTAC is not an option.  Per [facility] rep, pt will need to be on regular HD schedule prior to [facility] admission.  [facility] states pt  is eligible, but they do not have any HD beds available at this time.  Will cont to follow pt's progress/update LTACs as appropriate.  12-15-13 11am [name], RNBSN [phone] [facility] referral placed to determine eligibility.  Will talk to patient if eligible.

## 2013-12-11 NOTE — Consult Note (Signed)
Gastroenterology Consult: 12:38 PM 12/11/2013  LOS: 1 day    Referring Provider: Marlin Canary MD Primary Care Physician:  Aida Puffer, MD Primary Gastroenterologist:  unassigned Rhematology, [facility]: Dr Zollie Pee.     Reason for Consultation:  Acute on chronic anemia, FOBT +   HPI: Charles [name] is a 73 y.o. male.  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 Renal biopsy at [facility] 11/2013. Hx heart failure, emphysema. S/p cataract surgery.  Recent admission at [facility] about early 11/2013 after falls at home.  Diagnosed with renal failure and work up diagnosed the nephritis, started on prednisone and cellcept and told to follow up with renal but this has not happened yet, no arrangements yet made.  He has come to live with family in [place]. Has been seen by a rhematologist at [facility] as outpt since discharge. Unable to provide much history and has no papers related to recent admission.   Dr Clarene Duke sent pt to ED for Hgb of 7.5, 7.0 today.  MCV normal. BUN/creatinine 95/4.7 and potassium level is 5.7.    On 8/20 Hgb was 9.5, Creat was 4.5.  He is confused, ? Uremia vs dementia.  Pt has hx of rectal bleeding and is FOBT + currently but not passing blood or melena.     Past Medical History  Diagnosis Date  . Hypertension   . Hyperlipidemia   . Emphysema lung   . Heart failure   . Stage IV lupus nephritis (WHO)     Past Surgical History  Procedure Laterality Date  . Eye surgery      Prior to Admission medications   Medication Sig Start Date End Date Taking? Authorizing Provider  albuterol (PROVENTIL HFA;VENTOLIN HFA) 108 (90 BASE) MCG/ACT inhaler Inhale 2 puffs into the lungs 3 (three) times daily.   Yes Historical Provider, MD  amLODipine (NORVASC) 10 MG tablet Take 10 mg by mouth daily.   Yes  Historical Provider, MD  atenolol (TENORMIN) 25 MG tablet Take 25 mg by mouth daily.   Yes Historical Provider, MD  budesonide-formoterol (SYMBICORT) 80-4.5 MCG/ACT inhaler Inhale 1 puff into the lungs daily.   Yes Historical Provider, MD  insulin aspart (NOVOLOG FLEXPEN) 100 UNIT/ML FlexPen Inject 5 Units into the skin 3 (three) times daily with meals. As needed for glucose more then 150   Yes Historical Provider, MD  MELATONIN PO Take 1 tablet by mouth at bedtime as needed (sleep).   Yes Historical Provider, MD  mycophenolate (CELLCEPT) 500 MG tablet Take 1,000 mg by mouth 2 (two) times daily.   Yes Historical Provider, MD  omeprazole (PRILOSEC) 20 MG capsule Take 20 mg by mouth daily.   Yes Historical Provider, MD  predniSONE (DELTASONE) 10 MG tablet Take 10 mg by mouth daily with lunch. Take along with 40 mg for a total of 50 mg daily   Yes Historical Provider, MD  predniSONE (DELTASONE) 20 MG tablet Take 20 mg by mouth 2 (two) times daily with a meal. Take along with 10 mg for a total of 50  mg   Yes Historical Provider, MD  tiotropium (SPIRIVA) 18 MCG inhalation capsule Place 18 mcg into inhaler and inhale daily.   Yes Historical Provider, MD    Scheduled Meds: . [START ON 12/12/2013] albuterol  2.5 mg Nebulization QPM  . albuterol  2.5 mg Nebulization Once  . amLODipine  10 mg Oral Daily  . atenolol  25 mg Oral Daily  . budesonide-formoterol  1 puff Inhalation Daily  . insulin aspart  0-9 Units Subcutaneous TID WC  . mycophenolate  1,000 mg Oral BID  . pantoprazole  40 mg Oral Daily  . predniSONE  10 mg Oral Q lunch  . predniSONE  20 mg Oral BID WC  . sodium chloride  3 mL Intravenous Q12H  . tiotropium  18 mcg Inhalation Daily   Infusions:   PRN Meds: HYDROcodone-acetaminophen   Allergies as of 12/10/2013  . (No Known Allergies)    History reviewed. No pertinent family history.  History   Social History  . Marital Status: Married    Spouse Name: N/A    Number of  Children: N/A  . Years of Education: N/A   Occupational History  . Not on file.   Social History Main Topics  . Smoking status: Former Games developer  . Smokeless tobacco: Former Neurosurgeon  . Alcohol Use: No  . Drug Use: No  . Sexual Activity: Not on file   Other Topics Concern  . Not on file   Social History Narrative  . No narrative on file    REVIEW OF SYSTEMS: Constitutional:  No fever or chills ENT:  No nose bleeds Pulm:  No sob CV:  No palpitations, no LE edema.  GU:  No hematuria, no frequency GI:  Denies abd pain, has had intermittent blood on toilet paper with BMs for years Heme:  anemia  Transfusions:  Pt does not recall Neuro:  No headaches, no peripheral tingling or numbness.  + headaches.  + falls.  Derm:  No itching, no rash or sores. Thinning hair.  Endocrine:  No sweats or chills.  No polyuria or dysuria Immunization: pt does not recall Travel:  None beyond local counties in last few months.    PHYSICAL EXAM: Vital signs in last 24 hours:  General Appearance:  Alert, oriented, no distress, appears stated age   Head:  Normocephalic, atraumatic   Eyes:  PERRL, EOMI, sclera non-icteric      Nose:  Nares without drainage or epistaxis. Mucosa, turbinates normal   Throat:  Moist mucous membranes. Oropharynx without erythema or exudate.   Neck:  Supple. No carotid bruits. No thyromegaly. No lymphadenopathy.   Back:  No CVA tenderness, no spinal tenderness   Lungs:  insp wheeze cleared with cough  Chest wall:  No tenderness to palpitation   Heart:  Regular rate and rhythm without murmurs, gallops, rubs   Abdomen:  Soft, non-tender, nondistended, normal bowel sounds, no organomegaly   Genitalia:  deferred   Rectal:  deferred   Extremities:  No clubbing, cyanosis or edema.   Pulses:  2+ and symmetric all extremities   Skin:  Skin color, texture, turgor normal, no rashes or lesions   Lymph nodes:  Cervical, supraclavicular, and axillary nodes normal   Neurologic:   Alert, affect appropriate      Intake/Output from previous day: 09/02 0701 - 09/03 0700 In: 100 [P.O.:100] Out: -  Intake/Output this shift: Total I/O In: 120 [P.O.:120] Out: -   LAB RESULTS:  Recent Labs  12/10/13 1510  12/11/13 0431  WBC 4.5 3.3*  HGB 7.4* 7.0*  HCT 22.7* 21.3*  PLT 77* 65*   BMET Lab Results  Component Value Date   NA 138 12/11/2013   NA 136* 12/10/2013   NA 139 12/26/2007   K 5.7* 12/11/2013   K 5.4* 12/10/2013   K 4.3 12/26/2007   CL 105 12/11/2013   CL 103 12/10/2013   CL 107 12/26/2007   CO2 17* 12/11/2013   CO2 16* 12/10/2013   GLUCOSE 109* 12/11/2013   GLUCOSE 209* 12/10/2013   GLUCOSE 115* 12/26/2007   BUN 95* 12/11/2013   BUN 95* 12/10/2013   BUN 11 12/26/2007   CREATININE 4.76* 12/11/2013   CREATININE 4.61* 12/10/2013   CREATININE 1.0 12/26/2007   CALCIUM 7.0* 12/11/2013   CALCIUM 7.2* 12/10/2013   LFT  Recent Labs  12/10/13 1510  PROT 4.6*  ALBUMIN 2.2*  AST 14  ALT 21  ALKPHOS 65  BILITOT 0.3   PT/INR No results found for this basename: INR, PROTIME   Hepatitis Panel hep B surface AG non-reactive. Hep B core Igg and Igm : reactive Hep C Ab   Non-reactive Hepatitis A IGG and IgM  Reactive Hepatitis A IgM non-reactive.     No results found for this basename: HEPBSAG, HCVAB, HEPAIGM, HEPBIGM,  in the last 72 hours C-Diff No components found with this basename: cdiff   Lipase  No results found for this basename: lipase    Drugs of Abuse  No results found for this basename: labopia, cocainscrnur, labbenz, amphetmu, thcu, labbarb     RADIOLOGY STUDIES: Dg Lumbar Spine Complete  12/10/2013   CLINICAL DATA:  Fall.  EXAM: LUMBAR SPINE - COMPLETE 4+ VIEW  COMPARISON:  MRI lumbar spine 06/18/2005.  FINDINGS: Paraspinal soft tissues are normal. Degenerative changes thoracolumbar spine. Diffuse osteopenia. No evidence of malalignment. Minimal L1 compression. This is new. No retropulsed fragments. Aortoiliac atherosclerotic vascular disease.   IMPRESSION: 1. Minimal L1 compression. No retropulsed fragments. This is a new finding from prior MRI of 06/18/2005. 2. Diffuse osteopenia and degenerative change. Normal bony alignment. 3. Aortoiliac atherosclerotic vascular disease.   Electronically Signed   By: Maisie Fus  Register   On: 12/10/2013 19:36    ENDOSCOPIC STUDIES: None availabale  IMPRESSION:   #1 anemia multifactorial #2 Limited rectal bleeding and history of colon polyp #3 thrombocytopenia hypoalbuminemia history of heavy alcohol use.  #4 stage IV lupus nephritis    PLAN:     Anemia- will check anemia panel. Check INR. Patient is to be scheduled for CT of the abdomen to evaluate for cirrhosis. He will need a repeat colonoscopy, but this can be done as an outpatient. He will be given an appointment to followup in the office upon discharge.Has appt to follow up with  GI Sept 28 at 9:30.Will sign off.   Cerinity Zynda, Tollie Pizza, PA-C  12/11/2013, 12:38 PM Pager: [phone]

## 2013-12-11 NOTE — Consult Note (Signed)
Reason for Consult:Progressive CKD from Lupus Nephritis Referring Physician: Benjamine Mola, MD  Charles [name] is an 73 y.o. male.  HPI: Pt is a 72yo WM with PMH sig for HTN, COPD, CKD stage 3 who was admitted to [facility] 11/03/13 wieh weakness, recurrent falls, and confusion.  His hospital course was c/b acute respiratory failure thought to be related to COPD exacerbation and volume excess.  W/u revealed +P-ANCA, ANA,, dsDNA, and Scr of 5.  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  He was started on prednisone and cellcept but moved to [facility] to be closer to his grandson before any outpt follow up could be established (although he has an appt with Dr. Allena [name] on 12/17/13).  Charles [name] was admitted on 12/10/13 after labs revealed a Hgb of 7.4.  His grandson reports that since his hospitalization, Charles [name] behavior has been erratic with mood swings and bursts of anger with profanity.  He has also refused PT at home.  We were asked to see the pt to further evaluate and manage his Lupus Nephritis.     Trend in Creatinine: Creatinine, Ser  Date/Time Value Ref Range Status  12/11/2013  4:31 AM 4.76* 0.50 - 1.35 mg/dL Final  4/0/9811  9:14 PM 4.61* 0.50 - 1.35 mg/dL Final  7/82/9562  1:30 AM 1.0   Final  02/13/2007 12:19 AM 0.9   Final    PMH:   Past Medical History  Diagnosis Date  . Hypertension   . Hyperlipidemia   . Emphysema lung   . Heart failure   . Stage IV lupus nephritis (WHO)   . Anemia 11/2013    PSH:   Past Surgical History  Procedure Laterality Date  . Cataract extraction    . Renal biopsy, percutaneous  11/2013    at [facility]    Allergies: No Known Allergies  Medications:   Prior to Admission medications    Medication Sig Start Date End Date Taking? Authorizing Provider  albuterol (PROVENTIL HFA;VENTOLIN HFA) 108 (90 BASE) MCG/ACT inhaler Inhale 2 puffs into the lungs 3 (three) times daily.   Yes Historical Provider, MD  amLODipine (NORVASC) 10 MG tablet Take 10 mg by mouth daily.   Yes Historical Provider, MD  atenolol (TENORMIN) 25 MG tablet Take 25 mg by mouth daily.   Yes Historical Provider, MD  budesonide-formoterol (SYMBICORT) 80-4.5 MCG/ACT inhaler Inhale 1 puff into the lungs daily.   Yes Historical Provider, MD  insulin aspart (NOVOLOG FLEXPEN) 100 UNIT/ML FlexPen Inject 5 Units into the skin 3 (three) times daily with meals. As needed for glucose more then 150   Yes Historical Provider, MD  MELATONIN PO Take 1 tablet by mouth at bedtime as needed (sleep).   Yes Historical Provider, MD  mycophenolate (CELLCEPT) 500 MG tablet Take 1,000 mg by mouth 2 (two) times daily.   Yes Historical Provider, MD  omeprazole (PRILOSEC) 20 MG capsule Take 20 mg by mouth daily.   Yes Historical Provider, MD  predniSONE (DELTASONE) 10 MG tablet Take 10 mg by mouth daily with lunch. Take along with 40 mg for a total of 50 mg daily   Yes Historical Provider, MD  predniSONE (DELTASONE) 20 MG tablet Take 20 mg by mouth 2 (two) times daily with a meal. Take along with 10 mg for a total  of 50 mg   Yes Historical Provider, MD  tiotropium (SPIRIVA) 18 MCG inhalation capsule Place 18 mcg into inhaler and inhale daily.   Yes Historical Provider, MD    Inpatient medications: . [START ON 12/12/2013] albuterol  2.5 mg Nebulization QPM  . albuterol  2.5 mg Nebulization Once  . amLODipine  10 mg Oral Daily  . atenolol  25 mg Oral Daily  . budesonide-formoterol  1 puff Inhalation Daily  . insulin aspart  0-9 Units Subcutaneous TID WC  . mycophenolate  1,000 mg Oral BID  . pantoprazole  40 mg Oral Daily  . predniSONE  10 mg Oral Q lunch  . predniSONE  20 mg Oral BID WC  . sodium chloride  3 mL Intravenous Q12H  .  tiotropium  18 mcg Inhalation Daily    Discontinued Meds:   Medications Discontinued During This Encounter  Medication Reason  . sodium chloride 0.9 % bolus 500 mL   . esomeprazole (NEXIUM) 40 MG capsule Change in therapy  . albuterol (PROVENTIL HFA;VENTOLIN HFA) 108 (90 BASE) MCG/ACT inhaler 2 puff Formulary change  . predniSONE (DELTASONE) tablet 40 mg Duplicate  . predniSONE (DELTASONE) tablet 10 mg Duplicate  . predniSONE (DELTASONE) tablet 50 mg Duplicate  . albuterol (PROVENTIL) (2.5 MG/3ML) 0.083% nebulizer solution 2.5 mg     Social History:  reports that he has quit smoking. He has quit using smokeless tobacco. He reports that he does not drink alcohol or use illicit drugs.  Family History:  History reviewed. No pertinent family history.  Review of systems not obtained due to patient factors. Pt was a poor historian and had very poor insight into what happened at the outside hospital. Weight change:   Intake/Output Summary (Last 24 hours) at 12/11/13 1312 Last data filed at 12/11/13 0952  Gross per 24 hour  Intake    220 ml  Output      0 ml  Net    220 ml   BP 134/67  Pulse 73  Temp(Src) 98.6 F (37 C) (Oral)  Resp 20  Wt 89.268 kg (196 lb 12.8 oz)  SpO2 93% Filed Vitals:   12/10/13 2022 12/10/13 2100 12/11/13 0633 12/11/13 0942  BP: 137/60 124/62 134/67   Pulse: 75 78 90 73  Temp:  98.1 F (36.7 C) 98.6 F (37 C)   TempSrc:  Oral Oral   Resp: Weight:  89.268 kg (196 lb 12.8 oz)    SpO2: 98% 98% 99% 93%     General appearance: alert, fatigued and slowed mentation Head: Normocephalic, without obvious abnormality, atraumatic Eyes: negative findings: lids and lashes normal, conjunctivae and sclerae normal and corneas clear Resp: rhonchi bilaterally Cardio: regular rate and rhythm and no rub GI: obese, +BS, soft, NT Extremities: edema 3+ pitting edema of lower ext  Labs: Basic Metabolic Panel:  Recent Labs Lab 12/10/13 1510  12/11/13 0431  NA 136* 138  K 5.4* 5.7*  CL 103 105  CO2 16* 17*  GLUCOSE 209* 109*  BUN 95* 95*  CREATININE 4.61* 4.76*  ALBUMIN 2.2*  --   CALCIUM 7.2* 7.0*   Liver Function Tests:  Recent Labs Lab 12/10/13 1510  AST 14  ALT 21  ALKPHOS 65  BILITOT 0.3  PROT 4.6*  ALBUMIN 2.2*   No results found for this basename: LIPASE, AMYLASE,  in the last 168 hours No results found for this basename: AMMONIA,  in the last 168 hours CBC:  Recent Labs  Lab 12/10/13 1510 12/11/13 0431  WBC 4.5 3.3*  HGB 7.4* 7.0*  HCT 22.7* 21.3*  MCV 83.5 84.5  PLT 77* 65*   PT/INR: (inr:5) Cardiac Enzymes: )No results found for this basename: CKTOTAL, CKMB, CKMBINDEX, TROPONINI,  in the last 168 hours CBG:  Recent Labs Lab 12/10/13 2132 12/11/13 0639 12/11/13 1117  GLUCAP 180* 115* 189*    Iron Studies: No results found for this basename: IRON, TIBC, TRANSFERRIN, FERRITIN,  in the last 168 hours  Xrays/Other Studies: Dg Lumbar Spine Complete  12/10/2013   CLINICAL DATA:  Fall.  EXAM: LUMBAR SPINE - COMPLETE 4+ VIEW  COMPARISON:  MRI lumbar spine 06/18/2005.  FINDINGS: Paraspinal soft tissues are normal. Degenerative changes thoracolumbar spine. Diffuse osteopenia. No evidence of malalignment. Minimal L1 compression. This is new. No retropulsed fragments. Aortoiliac atherosclerotic vascular disease.  IMPRESSION: 1. Minimal L1 compression. No retropulsed fragments. This is a new finding from prior MRI of 06/18/2005. 2. Diffuse osteopenia and degenerative change. Normal bony alignment. 3. Aortoiliac atherosclerotic vascular disease.   Electronically Signed   By: Maisie Fus  Register   On: 12/10/2013 19:36     Assessment/Plan: 1.  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  Pt also had moderate arterionephrosclerosis with superimposed acute tubular injury (so possibly will improve).  Currently on cellcept and prednisone but discussed with pt's  grandson the severity of his CKD and the possible need for HD in the future.  Cont with cellcept for now and titrate prednisone as able given AMS and mood swings.   2. Nephrotic syndrome/volume overload- will start IV lasix and follow 3. CKD stage 4-5: discussed the need for compliance with HD if he chooses to pursue this when needed (concern for compliance given he has refused PT prior to admission as well as pulling out IV's while in the hospital) 4. Metabolic acidosis- will give small amount of isotonic bicarb and follow K and CO2 5. Hyperkalemia- bicarb and IV lasix, recheck in am 6. S/p fall with new L1 compression on MRI 7. Rectal bleeding- w/u per GI; for colonoscopy as an outpt  8. Anemia- likely combo of CKD/acute illness, GIB.  Will start EPO and follow iron stores 9. DM- per primary svc 10. Pancytopenia- will decrease cellcept and follow. 11. Protein malnutrition- per primary svc   Lizzy Hamre A 12/11/2013, 1:12 PM

## 2013-12-12 DIAGNOSIS — E8779 Other fluid overload: Secondary | ICD-10-CM | POA: Diagnosis not present

## 2013-12-12 DIAGNOSIS — E875 Hyperkalemia: Secondary | ICD-10-CM | POA: Diagnosis present

## 2013-12-12 DIAGNOSIS — R627 Adult failure to thrive: Secondary | ICD-10-CM | POA: Diagnosis present

## 2013-12-12 DIAGNOSIS — I120 Hypertensive chronic kidney disease with stage 5 chronic kidney disease or end stage renal disease: Secondary | ICD-10-CM | POA: Diagnosis present

## 2013-12-12 DIAGNOSIS — IMO0002 Reserved for concepts with insufficient information to code with codable children: Secondary | ICD-10-CM | POA: Diagnosis not present

## 2013-12-12 DIAGNOSIS — N059 Unspecified nephritic syndrome with unspecified morphologic changes: Secondary | ICD-10-CM | POA: Diagnosis present

## 2013-12-12 DIAGNOSIS — E872 Acidosis, unspecified: Secondary | ICD-10-CM | POA: Diagnosis present

## 2013-12-12 DIAGNOSIS — D631 Anemia in chronic kidney disease: Secondary | ICD-10-CM | POA: Diagnosis present

## 2013-12-12 DIAGNOSIS — N186 End stage renal disease: Secondary | ICD-10-CM | POA: Diagnosis present

## 2013-12-12 DIAGNOSIS — N039 Chronic nephritic syndrome with unspecified morphologic changes: Secondary | ICD-10-CM | POA: Diagnosis present

## 2013-12-12 DIAGNOSIS — Z794 Long term (current) use of insulin: Secondary | ICD-10-CM | POA: Diagnosis not present

## 2013-12-12 DIAGNOSIS — E785 Hyperlipidemia, unspecified: Secondary | ICD-10-CM | POA: Diagnosis present

## 2013-12-12 DIAGNOSIS — S32009A Unspecified fracture of unspecified lumbar vertebra, initial encounter for closed fracture: Secondary | ICD-10-CM | POA: Diagnosis present

## 2013-12-12 DIAGNOSIS — D61818 Other pancytopenia: Secondary | ICD-10-CM | POA: Diagnosis present

## 2013-12-12 DIAGNOSIS — F39 Unspecified mood [affective] disorder: Secondary | ICD-10-CM | POA: Diagnosis present

## 2013-12-12 DIAGNOSIS — N058 Unspecified nephritic syndrome with other morphologic changes: Secondary | ICD-10-CM | POA: Diagnosis present

## 2013-12-12 DIAGNOSIS — E1129 Type 2 diabetes mellitus with other diabetic kidney complication: Secondary | ICD-10-CM | POA: Diagnosis present

## 2013-12-12 DIAGNOSIS — Z79899 Other long term (current) drug therapy: Secondary | ICD-10-CM | POA: Diagnosis not present

## 2013-12-12 DIAGNOSIS — Z9181 History of falling: Secondary | ICD-10-CM | POA: Diagnosis not present

## 2013-12-12 DIAGNOSIS — T451X5A Adverse effect of antineoplastic and immunosuppressive drugs, initial encounter: Secondary | ICD-10-CM | POA: Diagnosis present

## 2013-12-12 DIAGNOSIS — E46 Unspecified protein-calorie malnutrition: Secondary | ICD-10-CM | POA: Diagnosis present

## 2013-12-12 DIAGNOSIS — M329 Systemic lupus erythematosus, unspecified: Secondary | ICD-10-CM | POA: Diagnosis present

## 2013-12-12 DIAGNOSIS — N049 Nephrotic syndrome with unspecified morphologic changes: Secondary | ICD-10-CM | POA: Diagnosis present

## 2013-12-12 DIAGNOSIS — Z806 Family history of leukemia: Secondary | ICD-10-CM | POA: Diagnosis not present

## 2013-12-12 DIAGNOSIS — J438 Other emphysema: Secondary | ICD-10-CM | POA: Diagnosis present

## 2013-12-12 DIAGNOSIS — W010XXA Fall on same level from slipping, tripping and stumbling without subsequent striking against object, initial encounter: Secondary | ICD-10-CM | POA: Diagnosis present

## 2013-12-12 DIAGNOSIS — Z801 Family history of malignant neoplasm of trachea, bronchus and lung: Secondary | ICD-10-CM | POA: Diagnosis not present

## 2013-12-12 DIAGNOSIS — Z87891 Personal history of nicotine dependence: Secondary | ICD-10-CM | POA: Diagnosis not present

## 2013-12-12 LAB — RENAL FUNCTION PANEL
Albumin: 2 g/dL — ABNORMAL LOW (ref 3.5–5.2)
Anion gap: 17 — ABNORMAL HIGH (ref 5–15)
BUN: 99 mg/dL — ABNORMAL HIGH (ref 6–23)
CALCIUM: 6.9 mg/dL — AB (ref 8.4–10.5)
CO2: 18 mEq/L — ABNORMAL LOW (ref 19–32)
Chloride: 101 mEq/L (ref 96–112)
Creatinine, Ser: 4.51 mg/dL — ABNORMAL HIGH (ref 0.50–1.35)
GFR calc Af Amer: 14 mL/min — ABNORMAL LOW (ref >90)
GFR calc non Af Amer: 12 mL/min — ABNORMAL LOW (ref >90)
GLUCOSE: 187 mg/dL — AB (ref 70–99)
Phosphorus: 6.3 mg/dL — ABNORMAL HIGH (ref 2.3–4.6)
Potassium: 5.2 mEq/L (ref 3.7–5.3)
SODIUM: 136 meq/L — AB (ref 137–147)

## 2013-12-12 LAB — CBC
HCT: 21.7 % — ABNORMAL LOW (ref 39.0–52.0)
HEMOGLOBIN: 7.1 g/dL — AB (ref 13.0–17.0)
MCH: 26.9 pg (ref 26.0–34.0)
MCHC: 32.7 g/dL (ref 30.0–36.0)
MCV: 82.2 fL (ref 78.0–100.0)
Platelets: 63 10*3/uL — ABNORMAL LOW (ref 150–400)
RBC: 2.64 MIL/uL — ABNORMAL LOW (ref 4.22–5.81)
RDW: 16.5 % — ABNORMAL HIGH (ref 11.5–15.5)
WBC: 3.4 10*3/uL — ABNORMAL LOW (ref 4.0–10.5)

## 2013-12-12 LAB — GLUCOSE, CAPILLARY
GLUCOSE-CAPILLARY: 146 mg/dL — AB (ref 70–99)
GLUCOSE-CAPILLARY: 122 mg/dL — AB (ref 70–99)
Glucose-Capillary: 182 mg/dL — ABNORMAL HIGH (ref 70–99)
Glucose-Capillary: 251 mg/dL — ABNORMAL HIGH (ref 70–99)

## 2013-12-12 LAB — IRON AND TIBC
Iron: 137 ug/dL — ABNORMAL HIGH (ref 42–135)
SATURATION RATIOS: 71 % — AB (ref 20–55)
TIBC: 192 ug/dL — ABNORMAL LOW (ref 215–435)
UIBC: 55 ug/dL — AB (ref 125–400)

## 2013-12-12 LAB — LIPASE, BLOOD: Lipase: 95 U/L — ABNORMAL HIGH (ref 11–59)

## 2013-12-12 MED ORDER — LORAZEPAM 0.5 MG PO TABS
0.5000 mg | ORAL_TABLET | Freq: Once | ORAL | Status: AC
  Administered 2013-12-12: 0.5 mg via ORAL
  Filled 2013-12-12: qty 1

## 2013-12-12 MED ORDER — SODIUM BICARBONATE 650 MG PO TABS
650.0000 mg | ORAL_TABLET | Freq: Two times a day (BID) | ORAL | Status: DC
  Administered 2013-12-12 – 2013-12-15 (×7): 650 mg via ORAL
  Filled 2013-12-12 (×8): qty 1

## 2013-12-12 MED ORDER — FUROSEMIDE 10 MG/ML IJ SOLN
160.0000 mg | Freq: Three times a day (TID) | INTRAVENOUS | Status: DC
  Administered 2013-12-12 – 2013-12-18 (×18): 160 mg via INTRAVENOUS
  Filled 2013-12-12 (×20): qty 16

## 2013-12-12 NOTE — Progress Notes (Signed)
Assessment/Plan:  1. 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 Pt also had moderate arterionephrosclerosis with superimposed acute tubular injury. Currently on cellcept and prednisone but discussed with pt's grandson the severity of his CKD and the possible need for HD in the future. Cont with cellcept for now and titrate prednisone as able given AMS and mood swings.  2. Nephrotic syndrome/volume overload- will increase furosemide dosage 3. CKD stage 5 Will show dialysis videos & begin education 4. Metabolic acidosis- PO bicarb 5. Hyperkalemia-improved 6. S/p fall with new L1 compression on MRI 7. Rectal bleeding- w/u per GI; for colonoscopy as an outpt  8. Anemia- likely combo of CKD/acute illness, GIB. Will start EPO and follow iron stores 9. DM- per primary svc 10. Pancytopenia- will decrease cellcept and follow. 11. Protein malnutrition- per primary svc  Subjective: Interval History: No UOP documented  Objective: Vital signs in last 24 hours: Temp:  [97.5 F (36.4 C)-97.7 F (36.5 C)] 97.6 F (36.4 C) (09/04 0553) Pulse Rate:  [70-87] 81 (09/04 0553) Resp:  [18-21] 21 (09/04 0553) BP: (114-153)/(59-64) 131/64 mmHg (09/04 0553) SpO2:  [93 %-100 %] 96 % (09/04 0907) Weight:  [87.998 kg (194 lb)] 87.998 kg (194 lb) (09/04 0700) Weight change: -1.27 kg (-2 lb 12.8 oz)  Intake/Output from previous day: 09/03 0701 - 09/04 0700 In: 480 [P.O.:480] Out: -  Intake/Output this shift:    General appearance: alert and cooperative Resp: clear to auscultation bilaterally Chest wall: no tenderness Cardio: regular rate and rhythm, S1, S2 normal, no murmur, click, rub or gallop Extremities: edema 2-3+  Lab Results:  Recent Labs  12/11/13 0431 12/12/13 0405  WBC 3.3* 3.4*  HGB 7.0* 7.1*  HCT 21.3* 21.7*  PLT 65* 63*   BMET:  Recent Labs  12/11/13 0431 12/12/13 0405  NA 138 136*  K 5.7* 5.2  CL 105 101  CO2 17* 18*  GLUCOSE 109* 187*   BUN 95* 99*  CREATININE 4.76* 4.51*  CALCIUM 7.0* 6.9*   No results found for this basename: PTH,  in the last 72 hours Iron Studies:  Recent Labs  12/11/13 1530  IRON 137*  TIBC 192*   Studies/Results: Ct Abdomen Wo Contrast  12/11/2013   CLINICAL DATA:  Evaluate for cirrhosis. Multifactorial anemia. Occasional rectal bleeding. Thrombocytopenia. Heavy alcohol use.  EXAM: CT ABDOMEN WITHOUT CONTRAST  TECHNIQUE: Multidetector CT imaging of the abdomen was performed following the standard protocol without IV contrast.  COMPARISON:  05/28/2005  FINDINGS: Lower Chest: Mild scarring at the right lung base. Mild cylindrical bronchiectasis at the left lung base which is most likely post infectious. Mild cardiomegaly. Anemia, as evidenced by decreased density in the intravascular space. Tiny hiatal hernia. No pericardial or pleural effusion.  Abdomen: No evidence of cirrhosis. Left hepatic lobe partially exophytic cyst measures 2.9 cm.  Normal spleen, stomach. There is upper abdominal edema which is likely centered about the pancreas and lesser sac. Example images 23-27 of series 2.  Normal gallbladder, without biliary ductal dilatation.  Normal adrenal glands. Interpolar right renal cyst anteriorly. Too small to characterize lesions in both kidneys, likely cysts. No hydronephrosis.  Aortic and branch vessel atherosclerosis. No retroperitoneal or retrocrural adenopathy. Mildly prominent thoracic duct incidentally noted.  Normal abdominal bowel loops, without pneumatosis or free intraperitoneal air. Trace fluid or thickening in the right pericolic gutter including on image 24. Nonspecific perirenal interstitial thickening. This is subtly increased in density, including on images 29 and 32 of series  2.  Bones/Musculoskeletal: Superior endplate compression deformity at L1. This is mild and favored to be acute to subacute. Sagittal image 67. This is detailed on yesterday's plain films.  IMPRESSION: 1. No evidence  of cirrhosis. 2. L1 compression deformity, likely acute/subacute. 3. Upper abdominal edema, favored to be within the peripancreatic space. Correlate with clinical and laboratory signs of pancreatitis. Less likely considerations include duodenitis. 4. Trace fluid within or thickening of the right pericolic gutter. Perirenal interstitial thickening is nonspecific and may relate to renal insufficiency. This does appear somewhat hyper attenuating. If there is a history of signet trauma that might cause hemorrhage arising from the pelvis (fell unlikely), dedicated pelvic imaging may be informative.   Electronically Signed   By: Jeronimo Greaves M.D.   On: 12/11/2013 17:06   Dg Lumbar Spine Complete  12/10/2013   CLINICAL DATA:  Fall.  EXAM: LUMBAR SPINE - COMPLETE 4+ VIEW  COMPARISON:  MRI lumbar spine 06/18/2005.  FINDINGS: Paraspinal soft tissues are normal. Degenerative changes thoracolumbar spine. Diffuse osteopenia. No evidence of malalignment. Minimal L1 compression. This is new. No retropulsed fragments. Aortoiliac atherosclerotic vascular disease.  IMPRESSION: 1. Minimal L1 compression. No retropulsed fragments. This is a new finding from prior MRI of 06/18/2005. 2. Diffuse osteopenia and degenerative change. Normal bony alignment. 3. Aortoiliac atherosclerotic vascular disease.   Electronically Signed   By: Maisie Fus  Register   On: 12/10/2013 19:36    Scheduled: . albuterol  2.5 mg Nebulization QPM  . amLODipine  10 mg Oral Daily  . atenolol  25 mg Oral Daily  . budesonide-formoterol  1 puff Inhalation Daily  . furosemide  160 mg Intravenous 3 times per day  . insulin aspart  0-9 Units Subcutaneous TID WC  . mycophenolate  500 mg Oral BID  . pantoprazole  40 mg Oral Daily  . predniSONE  10 mg Oral Q lunch  . predniSONE  20 mg Oral BID WC  . sodium chloride  3 mL Intravenous Q12H  . tiotropium  18 mcg Inhalation Daily      LOS: 2 days   Peytan Andringa C 12/12/2013,9:12 AM

## 2013-12-12 NOTE — Evaluation (Signed)
Occupational Therapy Evaluation Patient Details Name: Charles [name] MRN: [id number] DOB: 10-06-1940 Today's Date: 12/12/2013    History of Present Illness Pt admit with anemia.     Clinical Impression   Pt admitted with above. Pt will benefit from continued acute OT services to address below problem list. Recommend HHOT for d/c planning to assist with maximizing independence and safety with ADLs.    Follow Up Recommendations  Home health OT;Supervision/Assistance - 24 hour    Equipment Recommendations  None recommended by OT    Recommendations for Other Services       Precautions / Restrictions Precautions Precautions: Fall      Mobility Bed Mobility Overal bed mobility: Needs Assistance Bed Mobility: Supine to Sit;Sit to Supine     Supine to sit: Min assist Sit to supine: Min assist      Transfers Overall transfer level: Needs assistance Equipment used: Rolling walker (2 wheeled) Transfers: Sit to/from Stand Sit to Stand: Mod assist         General transfer comment: Assist to power up.    Balance                                            ADL Overall ADL's : Needs assistance/impaired     Grooming: Wash/dry hands;Minimal assistance;Standing               Lower Body Dressing: Moderate assistance;Sitting/lateral leans Lower Body Dressing Details (indicate cue type and reason): don/doff socks Toilet Transfer: Moderate assistance;Ambulation;Regular Toilet   Toileting- Clothing Manipulation and Hygiene: Minimal assistance;Sit to/from stand       Functional mobility during ADLs: Minimal assistance;Rolling walker General ADL Comments: Pt moving slowly.      Vision                     Perception     Praxis      Pertinent Vitals/Pain Pain Assessment: No/denies pain     Hand Dominance     Extremity/Trunk Assessment Upper Extremity Assessment Upper Extremity Assessment: Generalized weakness            Communication Communication Communication: No difficulties   Cognition Arousal/Alertness: Awake/alert Behavior During Therapy: WFL for tasks assessed/performed Overall Cognitive Status: Within Functional Limits for tasks assessed                     General Comments       Exercises       Shoulder Instructions      Home Living Family/patient expects to be discharged to:: Private residence Living Arrangements: Other relatives Available Help at Discharge: Family;Skilled Nursing Facility;Personal care attendant (CNA 5x/week for 2 hours) Type of Home: House Home Access: Stairs to enter [employer] of Steps: 1   Home Layout: One level         Firefighter: Standard     Home Equipment: Environmental consultant - 2 wheels;Bedside commode;Shower seat          Prior Functioning/Environment Level of Independence: Independent with assistive device(s)             OT Diagnosis: Generalized weakness   OT Problem List: Decreased strength;Decreased activity tolerance;Impaired balance (sitting and/or standing);Decreased knowledge of use of DME or AE   OT Treatment/Interventions: Self-care/ADL training;DME and/or AE instruction;Therapeutic activities;Patient/family education;Balance training    OT Goals(Current goals can be found in the  care plan section) Acute Rehab OT Goals Patient Stated Goal: to go home OT Goal Formulation: With patient Time For Goal Achievement: 12/19/13 Potential to Achieve Goals: Good  OT Frequency: Min 2X/week   Barriers to D/C:            Co-evaluation              End of Session Equipment Utilized During Treatment: Gait belt;Rolling walker  Activity Tolerance: Patient limited by fatigue Patient left: in bed;with call bell/phone within reach   Time: 0857-0913 OT Time Calculation (min): 16 min Charges:  OT General Charges $OT Visit: 1 Procedure OT Evaluation $Initial OT Evaluation Tier I: 1 Procedure OT Treatments $Self  Care/Home Management : 8-22 mins G-Codes: OT G-codes **NOT FOR INPATIENT CLASS** Functional Assessment Tool Used: clinical judgement Functional Limitation: Self care Self Care Current Status (Z6109): At least 20 percent but less than 40 percent impaired, limited or restricted Self Care Goal Status (U0454): At least 1 percent but less than 20 percent impaired, limited or restricted  Cipriano Mile 12/12/2013, 1:02 PM 12/12/2013 Cipriano Mile OTR/L Pager [phone] Office [phone]

## 2013-12-12 NOTE — Progress Notes (Signed)
UR Completed.  Lalla Laham Jane [phone] 12/12/2013  

## 2013-12-12 NOTE — Progress Notes (Signed)
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  Code Status: full Family Communication: no family at bedside- will contact grandson Disposition Plan:    Consultants:  GI  renal  Procedures:       HPI/Subjective: No overnight events, not sleeping well  Objective: Filed Vitals:   12/12/13 0553  BP: 131/64  Pulse: 81  Temp: 97.6 F (36.4 C)  Resp: 21    Intake/Output Summary (Last 24 hours) at 12/12/13 0921 Last data filed at Dec 14, 2013 1406  Gross per 24 hour  Intake    480 ml  Output      0 ml  Net    480 ml   Filed Weights   12/10/13 2100 12/12/13 0700  Weight: 89.268 kg (196 lb 12.8 oz) 87.998 kg (194 lb)    Exam:   General:  Pleasant/cooeprative  Cardiovascular: rrr  Respiratory: clear  Abdomen: +Bs, soft  Musculoskeletal: +edema in LE and left arm   Data Reviewed: Basic Metabolic Panel:  Recent Labs Lab 12/10/13 1510 Dec 14, 2013 0431 12/12/13 0405  NA 136* 138 136*  K 5.4* 5.7* 5.2  CL 103 105 101  CO2 16* 17* 18*  GLUCOSE 209* 109* 187*  BUN 95* 95* 99*  CREATININE 4.61* 4.76* 4.51*  CALCIUM 7.2* 7.0* 6.9*  PHOS  --   --  6.3*   Liver Function Tests:  Recent Labs Lab 12/10/13 1510 12/12/13 0405  AST 14  --   ALT 21  --   ALKPHOS 65  --   BILITOT 0.3  --   PROT 4.6*  --   ALBUMIN 2.2* 2.0*   No results found for this basename: LIPASE, AMYLASE,  in the last 168 hours No results found for this basename: AMMONIA,  in the last 168 hours CBC:  Recent Labs Lab 12/10/13 1510 12/14/2013 0431 12/12/13 0405  WBC 4.5 3.3* 3.4*  HGB  7.4* 7.0* 7.1*  HCT 22.7* 21.3* 21.7*  MCV 83.5 84.5 82.2  PLT 77* 65* 63*   Cardiac Enzymes: No results found for this basename: CKTOTAL, CKMB, CKMBINDEX, TROPONINI,  in the last 168 hours BNP (last 3 results) No results found for this basename: PROBNP,  in the last 8760 hours CBG:  Recent Labs Lab December 14, 2013 0639 14-Dec-2013 1117 2013-12-14 1607 2013-12-14 2130 12/12/13 0623  GLUCAP 115* 189* 212* 191* 146*    No results found for this or any previous visit (from the past 240 hour(s)).   Studies: Ct Abdomen Wo Contrast  Dec 14, 2013   CLINICAL DATA:  Evaluate for cirrhosis. Multifactorial anemia. Occasional rectal bleeding. Thrombocytopenia. Heavy alcohol use.  EXAM: CT ABDOMEN WITHOUT CONTRAST  TECHNIQUE: Multidetector CT imaging of the abdomen was performed following the standard protocol without IV contrast.  COMPARISON:  05/28/2005  FINDINGS: Lower Chest: Mild scarring at the right lung base. Mild cylindrical bronchiectasis at the left lung base which is most likely post infectious. Mild cardiomegaly. Anemia, as evidenced by decreased density in the intravascular space. Tiny hiatal hernia. No pericardial or pleural effusion.  Abdomen: No evidence of cirrhosis. Left hepatic lobe partially exophytic cyst measures 2.9 cm.  Normal spleen, stomach. There is  upper abdominal edema which is likely centered about the pancreas and lesser sac. Example images 23-27 of series 2.  Normal gallbladder, without biliary ductal dilatation.  Normal adrenal glands. Interpolar right renal cyst anteriorly. Too small to characterize lesions in both kidneys, likely cysts. No hydronephrosis.  Aortic and branch vessel atherosclerosis. No retroperitoneal or retrocrural adenopathy. Mildly prominent thoracic duct incidentally noted.  Normal abdominal bowel loops, without pneumatosis or free intraperitoneal air. Trace fluid or thickening in the right pericolic gutter including on image 24. Nonspecific perirenal interstitial  thickening. This is subtly increased in density, including on images 29 and 32 of series 2.  Bones/Musculoskeletal: Superior endplate compression deformity at L1. This is mild and favored to be acute to subacute. Sagittal image 67. This is detailed on yesterday's plain films.  IMPRESSION: 1. No evidence of cirrhosis. 2. L1 compression deformity, likely acute/subacute. 3. Upper abdominal edema, favored to be within the peripancreatic space. Correlate with clinical and laboratory signs of pancreatitis. Less likely considerations include duodenitis. 4. Trace fluid within or thickening of the right pericolic gutter. Perirenal interstitial thickening is nonspecific and may relate to renal insufficiency. This does appear somewhat hyper attenuating. If there is a history of signet trauma that might cause hemorrhage arising from the pelvis (fell unlikely), dedicated pelvic imaging may be informative.   Electronically Signed   By: Jeronimo Greaves M.D.   On: 12/11/2013 17:06   Dg Lumbar Spine Complete  12/10/2013   CLINICAL DATA:  Fall.  EXAM: LUMBAR SPINE - COMPLETE 4+ VIEW  COMPARISON:  MRI lumbar spine 06/18/2005.  FINDINGS: Paraspinal soft tissues are normal. Degenerative changes thoracolumbar spine. Diffuse osteopenia. No evidence of malalignment. Minimal L1 compression. This is new. No retropulsed fragments. Aortoiliac atherosclerotic vascular disease.  IMPRESSION: 1. Minimal L1 compression. No retropulsed fragments. This is a new finding from prior MRI of 06/18/2005. 2. Diffuse osteopenia and degenerative change. Normal bony alignment. 3. Aortoiliac atherosclerotic vascular disease.   Electronically Signed   By: Maisie Fus  Register   On: 12/10/2013 19:36    Scheduled Meds: . albuterol  2.5 mg Nebulization QPM  . amLODipine  10 mg Oral Daily  . atenolol  25 mg Oral Daily  . budesonide-formoterol  1 puff Inhalation Daily  . furosemide  160 mg Intravenous 3 times per day  . insulin aspart  0-9 Units Subcutaneous TID  WC  . mycophenolate  500 mg Oral BID  . pantoprazole  40 mg Oral Daily  . predniSONE  10 mg Oral Q lunch  . predniSONE  20 mg Oral BID WC  . sodium bicarbonate  650 mg Oral BID  . sodium chloride  3 mL Intravenous Q12H  . tiotropium  18 mcg Inhalation Daily   Continuous Infusions:  Antibiotics Given (last 72 hours)   None      Principal Problem:   Anemia of chronic kidney failure Active Problems:   Stage IV lupus nephritis (WHO)   CKD (chronic kidney disease) stage 5, GFR less than 15 ml/min   Occult blood positive stool   Thrombocytopenia   Compression fracture of L1 lumbar vertebra   Anemia, unspecified    Time spent: 25 min    Shaunessy Dobratz  [facility] Pager [phone]. If 7PM-7AM, please contact night-coverage at [email or web address], password [facility] 12/12/2013, 9:21 AM  LOS: 2 days

## 2013-12-12 NOTE — Consult Note (Signed)
GI Attending Note   Chart was reviewed and patient was examined. X-rays and lab were reviewed.    I agree with management and plans.  Lima Chillemi D. Cormick Moss, M.D., FACG [facility] Cell [phone]  

## 2013-12-12 NOTE — Discharge Instructions (Addendum)
Patient has been assigned to [facility] (Address: [address] [place], [place] [address]  [phone]).  Monday, Wednesday, Friday schedule 2nd shift. Patient is to arrive at kidney center on Monday Sept 14, 2015 to complete admission paperwork.      Food Basics for Chronic Kidney Disease When your kidneys are not working well, they cannot remove waste and excess substances from your blood as effectively as they did before. This can lead to a buildup and imbalance of these substances, which can affect how your body functions. This buildup can also make your kidneys work harder, causing even more damage. You may need to eat less of certain foods that can lead to the buildup of these substances in your body. By making the changes to your diet that are recommended by your dietitian or health care provider, you could possibly help prevent further kidney damage and delay or prevent the need for dialysis. The following information can help give you a basic understanding of these substances and how they affect your bodily functions. The information also gives examples of foods that contain the highest amounts of these substances. WHAT DO I NEED TO KNOW ABOUT SUBSTANCES IN MY FOOD THAT I MAY NEED TO ADJUST? Food adjustments will be different for each person with chronic kidney disease. It is important that you see a dietitian who can help you determine the specific adjustments that you will need to make for each of the following substances: Potassium Potassium affects how steadily your heart beats. If too much potassium builds up in your blood, it can cause an irregular heartbeat or even a heart attack. Examples of foods rich in potassium include:  Milk.  Fruits.  Vegetables. Phosphorus Phosphorus is a mineral found in your bones. A balance between calcium and phosphorous is needed to build and maintain healthy bones. Too much phosphorus pulls calcium from your bones. This can make  your bones weak and more likely to break. Too much phosphorus can also make your skin itch. Examples of foods rich in phosphorus include:  Milk and cheese.  Dried beans.  Peas.  Colas.  Nuts and peanut butter. Animal Protein Animal protein helps you make and keep muscle. It also helps in the repair of your body's cells and tissues. One of the natural breakdown products of protein is a waste product called urea. When your kidneys are not working properly, they cannot remove wastes such as urea like they did before you developed chronic kidney disease. You will likely need to limit the amount of protein you eat to help prevent a buildup of urea in your blood. Examples of animal protein include:  Meat (all types).  Fish and seafood.  Poultry.  Eggs. Sodium Sodium, which is found in salt, helps maintain a healthy balance of fluids in your body. Too much sodium can increase your blood pressure level and have a negative affect on the function of your heart and lungs. Too much sodium also can cause your body to retain too much fluid, making your kidneys work harder. Examples of foods with high levels of sodium include:  Salt seasonings.  Soy sauce.  Cured and processed meats.  Salted crackers and snack foods.  Fast food.  Canned soups and most canned foods. Glucose Glucose provides energy for your body. If you have diabetes mellitus that is not properly controlled, you have too much glucose in your blood. Too much glucose in your blood can worsen the function of your kidneys by damaging  small blood vessels. This prevents enough blood flow to your kidneys to give them what they need to work. If you have diabetes mellitus and chronic kidney disease, it is important to maintain your blood glucose at a level recommended by your health care provider. SHOULD I TAKE A VITAMIN AND MINERAL SUPPLEMENT? Because you may need to avoid eating certain foods, you may not get all of the vitamins and  minerals that would normally come from those foods. Your health care provider or dietitian may recommend that you take a supplement to ensure that you get all of the vitamins and minerals that your body needs.  Document Released: 06/17/2002 Document Revised: 08/11/2013 Document Reviewed: 02/21/2013 [facility] Patient Information 2015 [place], [place]. This information is not intended to replace advice given to you by your health care provider. Make sure you discuss any questions you have with your health care provider.

## 2013-12-12 NOTE — Progress Notes (Signed)
Physical Therapy Treatment Patient Details Name: Charles [name] MRN: [id number] DOB: 12-23-1940 Today's Date: 17-Dec-2013    History of Present Illness Pt admit with anemia.  PMH- lupus nephritis, COPD    PT Comments    Pt making steady progress.  Follow Up Recommendations  Home health PT;Supervision/Assistance - 24 hour     Equipment Recommendations  None recommended by PT    Recommendations for Other Services       Precautions / Restrictions Precautions Precautions: Fall    Mobility  Bed Mobility                  Transfers Overall transfer level: Needs assistance Equipment used: Rolling walker (2 wheeled) Transfers: Sit to/from Stand Sit to Stand: Mod assist         General transfer comment: Verbal cues for hand placement and assist to bring hips up from chair.  Ambulation/Gait Ambulation/Gait assistance: Min assist Ambulation Distance (Feet): 170 Feet Assistive device: Rolling walker (2 wheeled) Gait Pattern/deviations: Step-through pattern;Decreased step length - right;Decreased step length - left;Trunk flexed Gait velocity: decr Gait velocity interpretation: Below normal speed for age/gender General Gait Details: Verbal cues to stand more erect. Pt with dyspnea 3/4 with amb.   Stairs            Wheelchair Mobility    Modified Rankin (Stroke Patients Only)       Balance Overall balance assessment: Needs assistance         Standing balance support: Bilateral upper extremity supported Standing balance-Leahy Scale: Poor Standing balance comment: Use of walker for support.                    Cognition Arousal/Alertness: Awake/alert Behavior During Therapy: WFL for tasks assessed/performed         Memory: Decreased short-term memory              Exercises      General Comments        Pertinent Vitals/Pain Pain Assessment: No/denies pain    Home Living                      Prior Function            PT Goals (current goals can now be found in the care plan section) Progress towards PT goals: Progressing toward goals    Frequency  Min 3X/week    PT Plan Current plan remains appropriate    Co-evaluation             End of Session Equipment Utilized During Treatment: Gait belt Activity Tolerance: Patient limited by fatigue Patient left: in chair;with call bell/phone within reach;with chair alarm set;with nursing/sitter in room     Time: 1478-2956 PT Time Calculation (min): 9 min  Charges:  $Gait Training: 8-22 mins                    G Codes:      Charles [name] 12-17-2013, 5:26 PM  [facility] PT [phone]

## 2013-12-13 DIAGNOSIS — D696 Thrombocytopenia, unspecified: Secondary | ICD-10-CM

## 2013-12-13 LAB — RENAL FUNCTION PANEL
ALBUMIN: 1.9 g/dL — AB (ref 3.5–5.2)
ANION GAP: 16 — AB (ref 5–15)
BUN: 105 mg/dL — ABNORMAL HIGH (ref 6–23)
CHLORIDE: 103 meq/L (ref 96–112)
CO2: 20 mEq/L (ref 19–32)
Calcium: 7 mg/dL — ABNORMAL LOW (ref 8.4–10.5)
Creatinine, Ser: 4.67 mg/dL — ABNORMAL HIGH (ref 0.50–1.35)
GFR calc Af Amer: 13 mL/min — ABNORMAL LOW (ref >90)
GFR calc non Af Amer: 11 mL/min — ABNORMAL LOW (ref >90)
Glucose, Bld: 145 mg/dL — ABNORMAL HIGH (ref 70–99)
POTASSIUM: 4.7 meq/L (ref 3.7–5.3)
Phosphorus: 5.9 mg/dL — ABNORMAL HIGH (ref 2.3–4.6)
SODIUM: 139 meq/L (ref 137–147)

## 2013-12-13 LAB — GLUCOSE, CAPILLARY
GLUCOSE-CAPILLARY: 177 mg/dL — AB (ref 70–99)
GLUCOSE-CAPILLARY: 87 mg/dL (ref 70–99)
Glucose-Capillary: 147 mg/dL — ABNORMAL HIGH (ref 70–99)
Glucose-Capillary: 149 mg/dL — ABNORMAL HIGH (ref 70–99)

## 2013-12-13 MED ORDER — PREDNISONE 20 MG PO TABS
30.0000 mg | ORAL_TABLET | Freq: Every day | ORAL | Status: DC
  Administered 2013-12-14 – 2013-12-16 (×3): 30 mg via ORAL
  Filled 2013-12-13 (×4): qty 1

## 2013-12-13 MED ORDER — HALOPERIDOL 1 MG PO TABS
1.0000 mg | ORAL_TABLET | Freq: Three times a day (TID) | ORAL | Status: DC | PRN
  Filled 2013-12-13: qty 1

## 2013-12-13 NOTE — Progress Notes (Addendum)
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 CKD stage 5 Will show dialysis videos & begin education, vein mapping.  I think we should get an access placed in arm before discharge. Cont iv furosemide.  Pt appears to be a poor candidate for immunosuppressive therapy with altered mental status thought due to steroids and pancytopenia perhaps due to Cellcept.   Reduce prednisone to  per day, hold Cellcept 2. S/p fall with new L1 compression on MRI 3. Rectal bleeding- w/u per GI; for colonoscopy as an outpt  4. Anemia- likely combo of CKD/acute illness, GIB. Will start EPO and follow iron stores 5. Pancytopenia- will stop cellcept and follow.   Subjective: Interval History: Did not see dialysis videos  Objective: Vital signs in last 24 hours: Temp:  [97.5 F (36.4 C)-98.5 F (36.9 C)] 97.5 F (36.4 C) (09/05 1338) Pulse Rate:  [72-80] 72 (09/05 1338) Resp:  [19-20] 19 (09/05 1338) BP: (118-136)/(59-65) 133/61 mmHg (09/05 1338) SpO2:  [100 %] 100 % (09/05 1338) Weight:  [87.5 kg (192 lb 14.4 oz)] 87.5 kg (192 lb 14.4 oz) (09/05 1610) Weight change: -0.498 kg (-1 lb 1.6 oz)  Intake/Output from previous day: 09/04 0701 - 09/05 0700 In: 360 [P.O.:360] Out: 1250 [Urine:1250] Intake/Output this shift: Total I/O In: 600 [P.O.:600] Out: 450 [Urine:450]  General appearance: alert and cooperative Resp: clear to auscultation bilaterally Chest wall: no tenderness Extremities: edema 1-2+  Lab Results:  Recent Labs  12/11/13 0431 12/12/13 0405  WBC 3.3* 3.4*  HGB 7.0* 7.1*  HCT 21.3* 21.7*  PLT 65* 63*   BMET:  Recent Labs  12/12/13 0405 12/13/13 0407  NA 136* 139  K 5.2 4.7  CL 101 103  CO2  18* 20  GLUCOSE 187* 145*  BUN 99* 105*  CREATININE 4.51* 4.67*  CALCIUM 6.9* 7.0*   No results found for this basename: PTH,  in the last 72 hours Iron Studies:  Recent Labs  12/11/13 1530  IRON 137*  TIBC 192*   Studies/Results: Ct Abdomen Wo Contrast  12/11/2013   CLINICAL DATA:  Evaluate for cirrhosis. Multifactorial anemia. Occasional rectal bleeding. Thrombocytopenia. Heavy alcohol use.  EXAM: CT ABDOMEN WITHOUT CONTRAST  TECHNIQUE: Multidetector CT imaging of the abdomen was performed following the standard protocol without IV contrast.  COMPARISON:  05/28/2005  FINDINGS: Lower Chest: Mild scarring at the right lung base. Mild cylindrical bronchiectasis at the left lung base which is most likely post infectious. Mild cardiomegaly. Anemia, as evidenced by decreased density in the intravascular space. Tiny hiatal hernia. No pericardial or pleural effusion.  Abdomen: No evidence of cirrhosis. Left hepatic lobe partially exophytic cyst measures 2.9 cm.  Normal spleen, stomach. There is upper abdominal edema which is likely centered about the pancreas and lesser sac. Example images 23-27 of series 2.  Normal gallbladder, without biliary ductal dilatation.  Normal adrenal glands. Interpolar right renal cyst anteriorly. Too small to characterize lesions in both kidneys, likely cysts. No hydronephrosis.  Aortic and branch vessel atherosclerosis. No retroperitoneal or retrocrural adenopathy. Mildly prominent thoracic duct incidentally noted.  Normal abdominal bowel loops, without pneumatosis or free intraperitoneal air. Trace fluid or thickening in the right pericolic gutter including on image 24. Nonspecific perirenal interstitial thickening. This is subtly increased in density, including on images 29 and  32 of series 2.  Bones/Musculoskeletal: Superior endplate compression deformity at L1. This is mild and favored to be acute to subacute. Sagittal image 67. This is detailed on yesterday's plain films.   IMPRESSION: 1. No evidence of cirrhosis. 2. L1 compression deformity, likely acute/subacute. 3. Upper abdominal edema, favored to be within the peripancreatic space. Correlate with clinical and laboratory signs of pancreatitis. Less likely considerations include duodenitis. 4. Trace fluid within or thickening of the right pericolic gutter. Perirenal interstitial thickening is nonspecific and may relate to renal insufficiency. This does appear somewhat hyper attenuating. If there is a history of signet trauma that might cause hemorrhage arising from the pelvis (fell unlikely), dedicated pelvic imaging may be informative.   Electronically Signed   By: Jeronimo Greaves M.D.   On: 12/11/2013 17:06   Scheduled: . albuterol  2.5 mg Nebulization QPM  . amLODipine  10 mg Oral Daily  . atenolol  25 mg Oral Daily  . budesonide-formoterol  1 puff Inhalation Daily  . furosemide  160 mg Intravenous 3 times per day  . insulin aspart  0-9 Units Subcutaneous TID WC  . mycophenolate  500 mg Oral BID  . pantoprazole  40 mg Oral Daily  . predniSONE  10 mg Oral Q lunch  . predniSONE  20 mg Oral BID WC  . sodium bicarbonate  650 mg Oral BID  . sodium chloride  3 mL Intravenous Q12H  . tiotropium  18 mcg Inhalation Daily     LOS: 3 days   Gerica Koble C 12/13/2013,2:37 PM

## 2013-12-13 NOTE — Progress Notes (Signed)
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 -add haldol PRN -required a sitter last PM  Code Status: full Family Communication: no family at bedside- tried to contact grandson- no answer Disposition Plan:    Consultants:  GI  renal  Procedures:       HPI/Subjective: Confused last PM- required a sitter  Objective: Filed Vitals:   12/13/13 0632  BP: 123/65  Pulse: 72  Temp: 97.7 F (36.5 C)  Resp: 19    Intake/Output Summary (Last 24 hours) at 12/13/13 0951 Last data filed at 12/13/13 8119  Gross per 24 hour  Intake    600 ml  Output   1700 ml  Net  -1100 ml   Filed Weights   12/10/13 2100 12/12/13 0700 12/13/13 1478  Weight: 89.268 kg (196 lb 12.8 oz) 87.998 kg (194 lb) 87.5 kg (192 lb 14.4 oz)    Exam:   General:  Pleasant/cooeprative- A+Ox3  Cardiovascular: rrr  Respiratory: clear  Abdomen: +Bs, soft  Musculoskeletal: +edema in b/l LE and left arm   Data Reviewed: Basic Metabolic Panel:  Recent Labs Lab 12/10/13 1510 2013-12-29 0431 12/12/13 0405 12/13/13 0407  NA 136* 138 136* 139  K 5.4* 5.7* 5.2 4.7  CL 103 105 101 103  CO2 16* 17* 18* 20  GLUCOSE 209* 109* 187* 145*  BUN 95* 95* 99* 105*  CREATININE 4.61* 4.76* 4.51* 4.67*  CALCIUM 7.2* 7.0* 6.9* 7.0*  PHOS  --   --  6.3* 5.9*   Liver Function Tests:  Recent Labs Lab 12/10/13 1510 12/12/13 0405 12/13/13 0407  AST 14  --   --   ALT 21  --   --   ALKPHOS 65  --   --   BILITOT 0.3  --   --   PROT 4.6*  --   --   ALBUMIN 2.2* 2.0* 1.9*    Recent Labs Lab  12/12/13 1520  LIPASE 95*   No results found for this basename: AMMONIA,  in the last 168 hours CBC:  Recent Labs Lab 12/10/13 1510 December 29, 2013 0431 12/12/13 0405  WBC 4.5 3.3* 3.4*  HGB 7.4* 7.0* 7.1*  HCT 22.7* 21.3* 21.7*  MCV 83.5 84.5 82.2  PLT 77* 65* 63*   Cardiac Enzymes: No results found for this basename: CKTOTAL, CKMB, CKMBINDEX, TROPONINI,  in the last 168 hours BNP (last 3 results) No results found for this basename: PROBNP,  in the last 8760 hours CBG:  Recent Labs Lab 12/12/13 0623 12/12/13 1134 12/12/13 1633 12/12/13 2034 12/13/13 0631  GLUCAP 146* 182* 251* 122* 147*    No results found for this or any previous visit (from the past 240 hour(s)).   Studies: Ct Abdomen Wo Contrast  29-Dec-2013   CLINICAL DATA:  Evaluate for cirrhosis. Multifactorial anemia. Occasional rectal bleeding. Thrombocytopenia. Heavy alcohol use.  EXAM: CT ABDOMEN WITHOUT CONTRAST  TECHNIQUE: Multidetector CT imaging of the abdomen was performed following the standard protocol without IV contrast.  COMPARISON:  05/28/2005  FINDINGS: Lower Chest: Mild scarring at the right lung base. Mild cylindrical bronchiectasis at the left lung base  which is most likely post infectious. Mild cardiomegaly. Anemia, as evidenced by decreased density in the intravascular space. Tiny hiatal hernia. No pericardial or pleural effusion.  Abdomen: No evidence of cirrhosis. Left hepatic lobe partially exophytic cyst measures 2.9 cm.  Normal spleen, stomach. There is upper abdominal edema which is likely centered about the pancreas and lesser sac. Example images 23-27 of series 2.  Normal gallbladder, without biliary ductal dilatation.  Normal adrenal glands. Interpolar right renal cyst anteriorly. Too small to characterize lesions in both kidneys, likely cysts. No hydronephrosis.  Aortic and branch vessel atherosclerosis. No retroperitoneal or retrocrural adenopathy. Mildly prominent thoracic duct incidentally noted.   Normal abdominal bowel loops, without pneumatosis or free intraperitoneal air. Trace fluid or thickening in the right pericolic gutter including on image 24. Nonspecific perirenal interstitial thickening. This is subtly increased in density, including on images 29 and 32 of series 2.  Bones/Musculoskeletal: Superior endplate compression deformity at L1. This is mild and favored to be acute to subacute. Sagittal image 67. This is detailed on yesterday's plain films.  IMPRESSION: 1. No evidence of cirrhosis. 2. L1 compression deformity, likely acute/subacute. 3. Upper abdominal edema, favored to be within the peripancreatic space. Correlate with clinical and laboratory signs of pancreatitis. Less likely considerations include duodenitis. 4. Trace fluid within or thickening of the right pericolic gutter. Perirenal interstitial thickening is nonspecific and may relate to renal insufficiency. This does appear somewhat hyper attenuating. If there is a history of signet trauma that might cause hemorrhage arising from the pelvis (fell unlikely), dedicated pelvic imaging may be informative.   Electronically Signed   By: Jeronimo Greaves M.D.   On: 12/11/2013 17:06    Scheduled Meds: . albuterol  2.5 mg Nebulization QPM  . amLODipine  10 mg Oral Daily  . atenolol  25 mg Oral Daily  . budesonide-formoterol  1 puff Inhalation Daily  . furosemide  160 mg Intravenous 3 times per day  . insulin aspart  0-9 Units Subcutaneous TID WC  . mycophenolate  500 mg Oral BID  . pantoprazole  40 mg Oral Daily  . predniSONE  10 mg Oral Q lunch  . predniSONE  20 mg Oral BID WC  . sodium bicarbonate  650 mg Oral BID  . sodium chloride  3 mL Intravenous Q12H  . tiotropium  18 mcg Inhalation Daily   Continuous Infusions:  Antibiotics Given (last 72 hours)   None      Principal Problem:   Anemia of chronic kidney failure Active Problems:   Stage IV lupus nephritis (WHO)   CKD (chronic kidney disease) stage 5, GFR less than  15 ml/min   Occult blood positive stool   Thrombocytopenia   Compression fracture of L1 lumbar vertebra   Anemia, unspecified   Anemia    Time spent: 25 min    Camile Esters  [facility] Pager [phone]. If 7PM-7AM, please contact night-coverage at [email or web address], password [facility] 12/13/2013, 9:51 AM  LOS: 3 days

## 2013-12-14 DIAGNOSIS — D61818 Other pancytopenia: Secondary | ICD-10-CM

## 2013-12-14 DIAGNOSIS — I10 Essential (primary) hypertension: Secondary | ICD-10-CM | POA: Diagnosis present

## 2013-12-14 DIAGNOSIS — N19 Unspecified kidney failure: Secondary | ICD-10-CM

## 2013-12-14 LAB — CBC
HCT: 22.6 % — ABNORMAL LOW (ref 39.0–52.0)
HEMOGLOBIN: 7.5 g/dL — AB (ref 13.0–17.0)
MCH: 27.8 pg (ref 26.0–34.0)
MCHC: 33.2 g/dL (ref 30.0–36.0)
MCV: 83.7 fL (ref 78.0–100.0)
Platelets: 49 10*3/uL — ABNORMAL LOW (ref 150–400)
RBC: 2.7 MIL/uL — ABNORMAL LOW (ref 4.22–5.81)
RDW: 16.4 % — ABNORMAL HIGH (ref 11.5–15.5)
WBC: 1.7 10*3/uL — ABNORMAL LOW (ref 4.0–10.5)

## 2013-12-14 LAB — RENAL FUNCTION PANEL
ALBUMIN: 2 g/dL — AB (ref 3.5–5.2)
ANION GAP: 15 (ref 5–15)
BUN: 106 mg/dL — AB (ref 6–23)
CHLORIDE: 97 meq/L (ref 96–112)
CO2: 22 mEq/L (ref 19–32)
Calcium: 7.1 mg/dL — ABNORMAL LOW (ref 8.4–10.5)
Creatinine, Ser: 4.62 mg/dL — ABNORMAL HIGH (ref 0.50–1.35)
GFR calc non Af Amer: 11 mL/min — ABNORMAL LOW (ref >90)
GFR, EST AFRICAN AMERICAN: 13 mL/min — AB (ref >90)
Glucose, Bld: 134 mg/dL — ABNORMAL HIGH (ref 70–99)
Phosphorus: 5.4 mg/dL — ABNORMAL HIGH (ref 2.3–4.6)
Potassium: 4.8 mEq/L (ref 3.7–5.3)
Sodium: 134 mEq/L — ABNORMAL LOW (ref 137–147)

## 2013-12-14 LAB — GLUCOSE, CAPILLARY
Glucose-Capillary: 111 mg/dL — ABNORMAL HIGH (ref 70–99)
Glucose-Capillary: 240 mg/dL — ABNORMAL HIGH (ref 70–99)
Glucose-Capillary: 148 mg/dL — ABNORMAL HIGH (ref 70–99)
Glucose-Capillary: 137 mg/dL — ABNORMAL HIGH (ref 70–99)

## 2013-12-14 NOTE — Progress Notes (Signed)
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 CKD stage 5 Will show dialysis videos & begin education, vein mapping. I think we should get an access placed in arm before discharge. Cont iv furosemide. Pt appears to be a poor candidate for immunosuppressive therapy with altered mental status thought due to steroids and pancytopenia perhaps due to Cellcept. Prednisone reduced to  per day & Cellcept held 2. S/p fall with new L1 compression on MRI 3. Rectal bleeding- w/u per GI; for colonoscopy as an outpt  4. Anemia-  5. Pancytopenia- worse off Cellcept.  Subjective: Interval History: Did not see videos; s/p vein mapping  Objective: Vital signs in last 24 hours: Temp:  [97.4 F (36.3 C)-97.8 F (36.6 C)] 97.8 F (36.6 C) (09/06 1321) Pulse Rate:  [73-79] 74 (09/06 1321) Resp:  [16-22] 16 (09/06 1321) BP: (93-122)/(50-70) 108/56 mmHg (09/06 1321) SpO2:  [94 %-100 %] 100 % (09/06 1321) Weight:  [85.3 kg (188 lb 0.8 oz)] 85.3 kg (188 lb 0.8 oz) (09/06 0637) Weight change: -2.2 kg (-4 lb 13.6 oz)  Intake/Output from previous day: 09/05 0701 - 09/06 0700 In: 1327 [P.O.:1195; IV Piggyback:132] Out: 1425 [Urine:1425] Intake/Output this shift: Total I/O In: -  Out: 200 [Urine:200]  General appearance: alert, cooperative and flat affect Resp: clear to auscultation bilaterally Chest wall: no tenderness Cardio: regular rate and rhythm, S1, S2 normal, no murmur, click, rub or gallop Extremities: edema 2+ pitting  Lab Results:  Recent Labs  12/12/13 0405 12/14/13 0415  WBC 3.4* 1.7*  HGB 7.1* 7.5*  HCT 21.7* 22.6*  PLT 63* 49*   BMET:  Recent Labs  12/13/13 0407 12/14/13 0415  NA 139 134*  K 4.7  4.8  CL 103 97  CO2 20 22  GLUCOSE 145* 134*  BUN 105* 106*  CREATININE 4.67* 4.62*  CALCIUM 7.0* 7.1*   No results found for this basename: PTH,  in the last 72 hours Iron Studies:  Recent Labs  12/11/13 1530  IRON 137*  TIBC 192*   Studies/Results: No results found.  Scheduled: . albuterol  2.5 mg Nebulization QPM  . amLODipine  10 mg Oral Daily  . atenolol  25 mg Oral Daily  . budesonide-formoterol  1 puff Inhalation Daily  . furosemide  160 mg Intravenous 3 times per day  . insulin aspart  0-9 Units Subcutaneous TID WC  . pantoprazole  40 mg Oral Daily  . predniSONE  30 mg Oral Q breakfast  . sodium bicarbonate  650 mg Oral BID  . sodium chloride  3 mL Intravenous Q12H  . tiotropium  18 mcg Inhalation Daily     LOS: 4 days   Markail Diekman C 12/14/2013,1:56 PM

## 2013-12-14 NOTE — Progress Notes (Signed)
Right  Upper Extremity Vein Map    Cephalic  Segment Diameter Depth Comment  1. Axilla 1.34mm    2. Mid upper arm 1.67mm    3. Above AC 1.22mm    4. In AC 0.73mm    5. Below AC   Unable to visualize  6. Mid forearm   Unable to visualize  7. Wrist   Unable to visualize                  Basilic  Segment Diameter Depth Comment  1. Axilla mm mm   2. Mid upper arm 4.54mm 12.37mm   3. Above [facility] 4.23mm 11.68mm   4. In [facility] 4.64mm 7.31mm Branch  5. Below AC 2.12mm 3.16mm   6. Mid forearm 3.34mm 2.40mm   7. Wrist 4.55mm 1.83mm    mm mm    mm mm    mm mm     Left Upper Extremity Vein Map    Cephalic Vein appears to have thick walls. Segment Diameter Depth Comment  1. Axilla 1.36mm mm   2. Mid upper arm 2.1mm mm   3. Above AC 2.22mm mm   4. In AC 4.67mm mm   5. Below AC 2.51mm mm   6. Mid forearm 3.95mm mm   7. Wrist 2.67mm mm    mm mm    mm mm    mm mm    Basilic  Segment Diameter Depth Comment  1. Axilla 3.29mm 17.89mm   2. Mid upper arm 3.59mm 10.53mm Branch  3. Above AC   Thrombus.  4. In [facility]     5. Below AC     6. Mid forearm     7. Wrist                      12/14/2013 11:56 AM Gertie Fey, RVT, RDCS, RDMS

## 2013-12-14 NOTE — Progress Notes (Signed)
Chart reviewed.   PROGRESS NOTE  Charles [name]:[id number] DOB: 05/18/1940 DOA: 12/10/2013 PCP: Aida Puffer, MD  Assessment/Plan: Anemia of CKD secondary to CKD/intermitteng limited LGIB -  hgb stable.  Lupus nephritis: cellcept stopped due to pancytopenia. Remains on pred 30 mg. Vein mapping and vascular access planned per nephrology  Pancytopenia: normal WBC 11/27/13 (from [facility]), hgb 9.5, platelet count 97K. Likely from cellcept, but will also d/c protonix for now. Check B12, folate  Occult blood positive stool - colonoscopy as outpatient. CT abd pelvis without cirrhosis  Compression fracture of L1 - norco PRN pain  Dementia vs uremia v. Prednisone effect haldol PRN. Check B12, folate, NH3, TSH  HTN: blood pressures borderline low. D/c norvasc  Nephrotic syndrome. Weight on admission 197 lbs, 188 today after diuresis. Still with 3+ edema legs  D/c tele  Code Status: full Family Communication: family at bedside Disposition Plan:    Consultants:  GI  renal  Procedures:   HPI/Subjective: No complaints. Per RN, not eating much. No agitation or confusion today.  Objective: Filed Vitals:   12/14/13 1321  BP: 108/56  Pulse: 74  Temp: 97.8 F (36.6 C)  Resp: 16    Intake/Output Summary (Last 24 hours) at 12/14/13 1548 Last data filed at 12/14/13 0900  Gross per 24 hour  Intake    727 ml  Output   1175 ml  Net   -448 ml   Filed Weights   12/12/13 0700 12/13/13 0632 12/14/13 0637  Weight: 87.998 kg (194 lb) 87.5 kg (192 lb 14.4 oz) 85.3 kg (188 lb 0.8 oz)    Exam:   General:  Pleasant/cooeprative- A+Ox3. Eating peanut butter crackers  Cardiovascular: rrr without MGR  Respiratory: clear without WRR  Abdomen: +Bs, soft  Musculoskeletal: 3+edema in b/l LE and left arm   Data Reviewed: Basic Metabolic Panel:  Recent Labs Lab 12/10/13 1510 12/11/13 0431 12/12/13 0405 12/13/13 0407 12/14/13 0415  NA 136* 138 136* 139 134*  K 5.4* 5.7*  5.2 4.7 4.8  CL 103 105 101 103 97  CO2 16* 17* 18* 20 22  GLUCOSE 209* 109* 187* 145* 134*  BUN 95* 95* 99* 105* 106*  CREATININE 4.61* 4.76* 4.51* 4.67* 4.62*  CALCIUM 7.2* 7.0* 6.9* 7.0* 7.1*  PHOS  --   --  6.3* 5.9* 5.4*   Liver Function Tests:  Recent Labs Lab 12/10/13 1510 12/12/13 0405 12/13/13 0407 12/14/13 0415  AST 14  --   --   --   ALT 21  --   --   --   ALKPHOS 65  --   --   --   BILITOT 0.3  --   --   --   PROT 4.6*  --   --   --   ALBUMIN 2.2* 2.0* 1.9* 2.0*    Recent Labs Lab 12/12/13 1520  LIPASE 95*   No results found for this basename: AMMONIA,  in the last 168 hours CBC:  Recent Labs Lab 12/10/13 1510 12/11/13 0431 12/12/13 0405 12/14/13 0415  WBC 4.5 3.3* 3.4* 1.7*  HGB 7.4* 7.0* 7.1* 7.5*  HCT 22.7* 21.3* 21.7* 22.6*  MCV 83.5 84.5 82.2 83.7  PLT 77* 65* 63* 49*   Cardiac Enzymes: No results found for this basename: CKTOTAL, CKMB, CKMBINDEX, TROPONINI,  in the last 168 hours BNP (last 3 results) No results found for this basename: PROBNP,  in the last 8760 hours CBG:  Recent Labs Lab 12/13/13 1136 12/13/13 1605 12/13/13 2202 12/14/13  1610 12/14/13 1116  GLUCAP 149* 177* 87 111* 240*    No results found for this or any previous visit (from the past 240 hour(s)).   Studies: No results found.  Scheduled Meds: . albuterol  2.5 mg Nebulization QPM  . amLODipine  10 mg Oral Daily  . atenolol  25 mg Oral Daily  . budesonide-formoterol  1 puff Inhalation Daily  . furosemide  160 mg Intravenous 3 times per day  . insulin aspart  0-9 Units Subcutaneous TID WC  . pantoprazole  40 mg Oral Daily  . predniSONE  30 mg Oral Q breakfast  . sodium bicarbonate  650 mg Oral BID  . sodium chloride  3 mL Intravenous Q12H  . tiotropium  18 mcg Inhalation Daily   Continuous Infusions:  Antibiotics Given (last 72 hours)   None     Time spent: 35 min  Karan Inclan L  [facility] Pager [phone]. If 7PM-7AM, please contact  night-coverage at [email or web address], password [facility] 12/14/2013, 3:48 PM  LOS: 4 days

## 2013-12-14 NOTE — Progress Notes (Signed)
12/14/2013 11:50 PM Progress Notes The patient has been having diarrhea like bowel movements since last night. The stool is liquid and loose. Will continue to monitor patient and see if further orders are needed. Harriet Masson

## 2013-12-15 LAB — GLUCOSE, CAPILLARY
GLUCOSE-CAPILLARY: 109 mg/dL — AB (ref 70–99)
GLUCOSE-CAPILLARY: 158 mg/dL — AB (ref 70–99)
GLUCOSE-CAPILLARY: 169 mg/dL — AB (ref 70–99)
Glucose-Capillary: 190 mg/dL — ABNORMAL HIGH (ref 70–99)

## 2013-12-15 LAB — CBC WITH DIFFERENTIAL/PLATELET
BASOS ABS: 0 10*3/uL (ref 0.0–0.1)
Basophils Relative: 0 % (ref 0–1)
Eosinophils Absolute: 0 10*3/uL (ref 0.0–0.7)
Eosinophils Relative: 1 % (ref 0–5)
HCT: 21 % — ABNORMAL LOW (ref 39.0–52.0)
HEMOGLOBIN: 7 g/dL — AB (ref 13.0–17.0)
LYMPHS PCT: 30 % (ref 12–46)
Lymphs Abs: 0.3 10*3/uL — ABNORMAL LOW (ref 0.7–4.0)
MCH: 27.8 pg (ref 26.0–34.0)
MCHC: 33.3 g/dL (ref 30.0–36.0)
MCV: 83.3 fL (ref 78.0–100.0)
Monocytes Absolute: 0.1 10*3/uL (ref 0.1–1.0)
Monocytes Relative: 7 % (ref 3–12)
NEUTROS PCT: 62 % (ref 43–77)
Neutro Abs: 0.7 10*3/uL — ABNORMAL LOW (ref 1.7–7.7)
PLATELETS: 38 10*3/uL — AB (ref 150–400)
RBC: 2.52 MIL/uL — ABNORMAL LOW (ref 4.22–5.81)
RDW: 16.1 % — AB (ref 11.5–15.5)
WBC: 1.1 10*3/uL — CL (ref 4.0–10.5)

## 2013-12-15 LAB — RENAL FUNCTION PANEL
ANION GAP: 17 — AB (ref 5–15)
Albumin: 1.9 g/dL — ABNORMAL LOW (ref 3.5–5.2)
BUN: 106 mg/dL — AB (ref 6–23)
CO2: 20 meq/L (ref 19–32)
Calcium: 6.9 mg/dL — ABNORMAL LOW (ref 8.4–10.5)
Chloride: 96 mEq/L (ref 96–112)
Creatinine, Ser: 4.56 mg/dL — ABNORMAL HIGH (ref 0.50–1.35)
GFR calc Af Amer: 13 mL/min — ABNORMAL LOW (ref >90)
GFR calc non Af Amer: 12 mL/min — ABNORMAL LOW (ref >90)
GLUCOSE: 106 mg/dL — AB (ref 70–99)
PHOSPHORUS: 5.1 mg/dL — AB (ref 2.3–4.6)
POTASSIUM: 4.2 meq/L (ref 3.7–5.3)
Sodium: 133 mEq/L — ABNORMAL LOW (ref 137–147)

## 2013-12-15 LAB — VITAMIN B12: VITAMIN B 12: 440 pg/mL (ref 211–911)

## 2013-12-15 LAB — TSH: TSH: 1.43 u[IU]/mL (ref 0.350–4.500)

## 2013-12-15 LAB — PREPARE RBC (CROSSMATCH)

## 2013-12-15 LAB — AMMONIA: AMMONIA: 23 umol/L (ref 11–60)

## 2013-12-15 MED ORDER — NEPRO/CARBSTEADY PO LIQD
237.0000 mL | Freq: Three times a day (TID) | ORAL | Status: DC
  Administered 2013-12-15 – 2013-12-21 (×10): 237 mL via ORAL
  Filled 2013-12-15 (×23): qty 237

## 2013-12-15 MED ORDER — DARBEPOETIN ALFA-POLYSORBATE 100 MCG/0.5ML IJ SOLN
100.0000 ug | INTRAMUSCULAR | Status: DC
  Administered 2013-12-15: 100 ug via SUBCUTANEOUS
  Filled 2013-12-15: qty 0.5

## 2013-12-15 NOTE — Progress Notes (Signed)
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 CKD stage 5 Will show dialysis videos & begin education, vein mapping done.  I feel he would likely do better with initiation of dialysis prior to discharge- have talked to [facility] about AV access and PC in the next couple of days. I also have spoken with the patients grandson about this and he is in agreement. Cont iv furosemide. Pt appears to be a poor candidate for immunosuppressive therapy with altered mental status thought due to steroids and pancytopenia perhaps due to Cellcept. Prednisone reduced to  per day & Cellcept held 2. S/p fall with new L1 compression on MRI 3. Rectal bleeding- w/u per GI; for colonoscopy as an outpt  4. Anemia- bleeding and CKD- will add aranesp- may need transfusion before all is said and done 5. Pancytopenia- worse off Cellcept but still feel is likely the cause 6. Confusion- steroids vs uremia- prednisone being weaned. 7. HTN/vol- overloaded- BP is good - on high dose IV lasix  Subjective: Interval History:  s/p vein mapping- according to grandson pt more aware and he has spoken to him about dialysis  Objective: Vital signs in last 24 hours: Temp:  [97.8 F (36.6 C)-98.4 F (36.9 C)] 98.4 F (36.9 C) (09/07 0624) Pulse Rate:  [74-84] 83 (09/07 0751) Resp:  [16] 16 (09/07 0751) BP: (108-124)/(56-65) 123/65 mmHg (09/07 0624) SpO2:  [95 %-100 %] 95 % (09/07 0751) Weight:  [82.827 kg (182 lb 9.6 oz)] 82.827 kg (182 lb 9.6 oz) (09/07 0624) Weight change: -2.473 kg (-5 lb 7.2 oz)  Intake/Output from previous day: 09/06 0701 - 09/07 0700 In: 456 [P.O.:390; IV Piggyback:66] Out: 1400 [Urine:1400] Intake/Output this shift: Total  I/O In: 120 [P.O.:120] Out: -   General appearance: alert, cooperative and flat affect Resp: clear to auscultation bilaterally Chest wall: no tenderness Cardio: regular rate and rhythm, S1, S2 normal, no murmur, click, rub or gallop Extremities: edema 2+ pitting  Lab Results:  Recent Labs  12/14/13 0415 12/15/13 0513  WBC 1.7* 1.1*  HGB 7.5* 7.0*  HCT 22.6* 21.0*  PLT 49* 38*   BMET:   Recent Labs  12/14/13 0415 12/15/13 0513  NA 134* 133*  K 4.8 4.2  CL 97 96  CO2 22 20  GLUCOSE 134* 106*  BUN 106* 106*  CREATININE 4.62* 4.56*  CALCIUM 7.1* 6.9*   No results found for this basename: PTH,  in the last 72 hours Iron Studies: No results found for this basename: IRON, TIBC, TRANSFERRIN, FERRITIN,  in the last 72 hours Studies/Results: No results found.  Scheduled: . albuterol  2.5 mg Nebulization QPM  . atenolol  25 mg Oral Daily  . budesonide-formoterol  1 puff Inhalation Daily  . feeding supplement (NEPRO CARB STEADY)  237 mL Oral TID BM  . furosemide  160 mg Intravenous 3 times per day  . insulin aspart  0-9 Units Subcutaneous TID WC  . predniSONE  30 mg Oral Q breakfast  . sodium bicarbonate  650 mg Oral BID  . sodium chloride  3 mL Intravenous Q12H  . tiotropium  18 mcg Inhalation Daily     LOS: 5 days   Denina Rieger A 12/15/2013,10:54 AM

## 2013-12-15 NOTE — Progress Notes (Signed)
12/15/2013 01:00 AM Progress Notes The patient asked to have a condom cath put on so that he would not need to quickly get out of bed to void and to avoid accidentally getting urine on the bed.  The condom cath was put on by Nena Alexander, [place]. Will continue to monitor patient's progress.  Harriet Masson

## 2013-12-15 NOTE — Progress Notes (Addendum)
PROGRESS NOTE  Charles [name] HQI:[id number] DOB: Dec 31, 1940 DOA: 12/10/2013 PCP: Aida Puffer, MD  Assessment/Plan: Anemia CKD/intermitteng limited LGIB/cellcept -  hgb 7.0. Left message with grandson to get permission for transfusion. Patient agreeable, but per nursing staff, intermittently disoriented. Staff reporting no overt bleeding  Lupus nephritis: cellcept stopped due to pancytopenia. Remains on pred 30 mg. Vein mapping and vascular access planned per nephrology  Pancytopenia: normal WBC 11/27/13 (from [facility]), hgb 9.5, platelet count 97K. Likely from cellcept, but will also d/c protonix for now. B12, folate pending. See above  Occult blood positive stool - colonoscopy as outpatient. CT abd pelvis without cirrhosis  Compression fracture of L1 - norco PRN pain  Dementia vs uremia v. Prednisone effect haldol PRN. Check B12, folate. NH3, TSH ok  HTN: norvasc d/c'd due to borderline blood pressures low  Nephrotic syndrome. Weight on admission 197 lbs, 182 today after diuresis. Still with 3+ edema legs  FTT: add nepro. Consult dietician. Likely from uremia  Code Status: full Family Communication: left message with son Disposition Plan:    Consultants:  GI  renal  Procedures:   HPI/Subjective: No complaints. Per RN, not eating much. No agitation. Per tech, disoriented to place  Objective: Filed Vitals:   12/15/13 0751  BP:   Pulse: 83  Temp:   Resp: 16    Intake/Output Summary (Last 24 hours) at 12/15/13 0834 Last data filed at 12/15/13 0643  Gross per 24 hour  Intake    456 ml  Output   1400 ml  Net   -944 ml   Filed Weights   12/13/13 0632 12/14/13 0637 12/15/13 0624  Weight: 87.5 kg (192 lb 14.4 oz) 85.3 kg (188 lb 0.8 oz) 82.827 kg (182 lb 9.6 oz)    Exam:   General:  Asleep, arousable, appropriate  Cardiovascular: rrr without MGR  Respiratory: clear without WRR  Abdomen: +Bs, soft  Musculoskeletal: 2+ edema. SCDs  Data Reviewed: Basic  Metabolic Panel:  Recent Labs Lab 12/11/13 0431 12/12/13 0405 12/13/13 0407 12/14/13 0415 12/15/13 0513  NA 138 136* 139 134* 133*  K 5.7* 5.2 4.7 4.8 4.2  CL 105 101 103 97 96  CO2 17* 18* GLUCOSE 109* 187* 145* 134* 106*  BUN 95* 99* 105* 106* 106*  CREATININE 4.76* 4.51* 4.67* 4.62* 4.56*  CALCIUM 7.0* 6.9* 7.0* 7.1* 6.9*  PHOS  --  6.3* 5.9* 5.4* 5.1*   Liver Function Tests:  Recent Labs Lab 12/10/13 1510 12/12/13 0405 12/13/13 0407 12/14/13 0415 12/15/13 0513  AST 14  --   --   --   --   ALT 21  --   --   --   --   ALKPHOS 65  --   --   --   --   BILITOT 0.3  --   --   --   --   PROT 4.6*  --   --   --   --   ALBUMIN 2.2* 2.0* 1.9* 2.0* 1.9*    Recent Labs Lab 12/12/13 1520  LIPASE 95*    Recent Labs Lab 12/15/13 0513  AMMONIA 23   CBC:  Recent Labs Lab 12/10/13 1510 12/11/13 0431 12/12/13 0405 12/14/13 0415 12/15/13 0513  WBC 4.5 3.3* 3.4* 1.7* 1.1*  NEUTROABS  --   --   --   --  0.7*  HGB 7.4* 7.0* 7.1* 7.5* 7.0*  HCT 22.7* 21.3* 21.7* 22.6* 21.0*  MCV 83.5 84.5 82.2 83.7 83.3  PLT 77* 65* 63* 49* 38*   Cardiac Enzymes: No results found for this basename: CKTOTAL, CKMB, CKMBINDEX, TROPONINI,  in the last 168 hours BNP (last 3 results) No results found for this basename: PROBNP,  in the last 8760 hours CBG:  Recent Labs Lab 12/14/13 0616 12/14/13 1116 12/14/13 1609 12/14/13 2113 12/15/13 0612  GLUCAP 111* 240* 148* 137* 109*    No results found for this or any previous visit (from the past 240 hour(s)).   Studies: No results found.  Scheduled Meds: . albuterol  2.5 mg Nebulization QPM  . atenolol  25 mg Oral Daily  . budesonide-formoterol  1 puff Inhalation Daily  . furosemide  160 mg Intravenous 3 times per day  . insulin aspart  0-9 Units Subcutaneous TID WC  . predniSONE  30 mg Oral Q breakfast  . sodium bicarbonate  650 mg Oral BID  . sodium chloride  3 mL Intravenous Q12H  . tiotropium  18 mcg Inhalation  Daily   Continuous Infusions:  Antibiotics Given (last 72 hours)   None     Time spent: 35 min  Christiane Ha, MD  [facility] Pager [phone]. If 7PM-7AM, please contact night-coverage at [email or web address], password [facility] 12/15/2013, 8:34 AM  LOS: 5 days

## 2013-12-15 NOTE — Progress Notes (Signed)
OT Cancellation Note  Patient Details Name: Charles [name] MRN: [id number] DOB: 05/21/1940   Cancelled Treatment:    Reason Eval/Treat Not Completed: Fatigue/lethargy limiting ability to participate - pt fatigued.  Will reattempt  Angelene Giovanni [place], OTR/L [phone]  12/15/2013, 2:38 PM

## 2013-12-15 NOTE — Progress Notes (Signed)
12/15/2013 6:31 AM Progress notes The patient had a critical lab value of WBC of 1.1 which is less than his last value on 12/14/2013 which was 1.7.  I just informed the attending physician of his lab values. No orders received yet.  Will continue to monitor patient. Harriet Masson

## 2013-12-15 NOTE — Progress Notes (Signed)
PT Cancellation Note  Patient Details Name: Charles [name] MRN: [id number] DOB: 1940-05-24   Cancelled Treatment:    Reason Eval/Treat Not Completed: Medical issues which prohibited therapy (low hgb and not feeling well.)   [name] 12/15/2013, 1:36 PM

## 2013-12-16 DIAGNOSIS — E1129 Type 2 diabetes mellitus with other diabetic kidney complication: Secondary | ICD-10-CM | POA: Diagnosis present

## 2013-12-16 LAB — RENAL FUNCTION PANEL
Albumin: 1.9 g/dL — ABNORMAL LOW (ref 3.5–5.2)
Anion gap: 16 — ABNORMAL HIGH (ref 5–15)
BUN: 105 mg/dL — ABNORMAL HIGH (ref 6–23)
CALCIUM: 6.7 mg/dL — AB (ref 8.4–10.5)
CO2: 23 meq/L (ref 19–32)
CREATININE: 4.4 mg/dL — AB (ref 0.50–1.35)
Chloride: 94 mEq/L — ABNORMAL LOW (ref 96–112)
GFR, EST AFRICAN AMERICAN: 14 mL/min — AB (ref >90)
GFR, EST NON AFRICAN AMERICAN: 12 mL/min — AB (ref >90)
GLUCOSE: 109 mg/dL — AB (ref 70–99)
PHOSPHORUS: 4.7 mg/dL — AB (ref 2.3–4.6)
Potassium: 4.1 mEq/L (ref 3.7–5.3)
Sodium: 133 mEq/L — ABNORMAL LOW (ref 137–147)

## 2013-12-16 LAB — CBC WITH DIFFERENTIAL/PLATELET
Basophils Absolute: 0 10*3/uL (ref 0.0–0.1)
Basophils Relative: 0 % (ref 0–1)
EOS ABS: 0 10*3/uL (ref 0.0–0.7)
Eosinophils Relative: 2 % (ref 0–5)
HCT: 22.9 % — ABNORMAL LOW (ref 39.0–52.0)
HEMOGLOBIN: 7.9 g/dL — AB (ref 13.0–17.0)
Lymphocytes Relative: 34 % (ref 12–46)
Lymphs Abs: 0.3 10*3/uL — ABNORMAL LOW (ref 0.7–4.0)
MCH: 28.2 pg (ref 26.0–34.0)
MCHC: 34.5 g/dL (ref 30.0–36.0)
MCV: 81.8 fL (ref 78.0–100.0)
MONO ABS: 0 10*3/uL — AB (ref 0.1–1.0)
Monocytes Relative: 4 % (ref 3–12)
NEUTROS PCT: 60 % (ref 43–77)
Neutro Abs: 0.7 10*3/uL — ABNORMAL LOW (ref 1.7–7.7)
Platelets: 35 10*3/uL — ABNORMAL LOW (ref 150–400)
RBC: 2.8 MIL/uL — ABNORMAL LOW (ref 4.22–5.81)
RDW: 15.2 % (ref 11.5–15.5)
WBC: 1 10*3/uL — CL (ref 4.0–10.5)

## 2013-12-16 LAB — GLUCOSE, CAPILLARY
GLUCOSE-CAPILLARY: 219 mg/dL — AB (ref 70–99)
GLUCOSE-CAPILLARY: 99 mg/dL (ref 70–99)
Glucose-Capillary: 110 mg/dL — ABNORMAL HIGH (ref 70–99)
Glucose-Capillary: 230 mg/dL — ABNORMAL HIGH (ref 70–99)
Glucose-Capillary: 190 mg/dL — ABNORMAL HIGH (ref 70–99)

## 2013-12-16 LAB — FOLATE RBC: RBC FOLATE: 1404 ng/mL — AB (ref >280)

## 2013-12-16 LAB — IRON AND TIBC
Iron: 77 ug/dL (ref 42–135)
Saturation Ratios: 45 % (ref 20–55)
TIBC: 172 ug/dL — ABNORMAL LOW (ref 215–435)
UIBC: 95 ug/dL — AB (ref 125–400)

## 2013-12-16 LAB — PATHOLOGIST SMEAR REVIEW

## 2013-12-16 LAB — FOLATE: FOLATE: 9.7 ng/mL

## 2013-12-16 LAB — FERRITIN: Ferritin: 916 ng/mL — ABNORMAL HIGH (ref 22–322)

## 2013-12-16 MED ORDER — PREDNISONE 20 MG PO TABS
20.0000 mg | ORAL_TABLET | Freq: Every day | ORAL | Status: DC
  Administered 2013-12-17 – 2013-12-20 (×4): 20 mg via ORAL
  Filled 2013-12-16 (×5): qty 1

## 2013-12-16 MED ORDER — CEFAZOLIN SODIUM-DEXTROSE 2-3 GM-% IV SOLR
2.0000 g | INTRAVENOUS | Status: AC
  Administered 2013-12-17: 2 g via INTRAVENOUS
  Filled 2013-12-16: qty 50

## 2013-12-16 MED ORDER — INSULIN ASPART 100 UNIT/ML ~~LOC~~ SOLN
5.0000 [IU] | Freq: Three times a day (TID) | SUBCUTANEOUS | Status: DC
  Administered 2013-12-19 – 2013-12-20 (×3): 5 [IU] via SUBCUTANEOUS

## 2013-12-16 MED ORDER — CEFAZOLIN SODIUM 1-5 GM-% IV SOLN: 1.0000 g | INTRAVENOUS | Status: DC

## 2013-12-16 NOTE — Progress Notes (Signed)
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  He requires min - mod A for BADLs, and fatigues fairly quickly, but did agree to OOB activity this pm  Follow Up Recommendations  Home health OT;Supervision/Assistance - 24 hour    Equipment Recommendations  None recommended by OT    Recommendations for Other Services      Precautions / Restrictions Precautions Precautions: Fall Precaution Comments: protective precautions        Mobility Bed Mobility Overal bed mobility: Needs Assistance Bed Mobility: Supine to Sit     Supine to sit: Min guard        Transfers Overall transfer level: Needs assistance Equipment used: Rolling walker (2 wheeled) Transfers: Sit to/from [employer] Sit to Stand: Min assist Stand pivot transfers: Min assist       General transfer comment: assist for walker management and balance     Balance Overall balance assessment: Needs assistance Sitting-balance support: Feet supported Sitting balance-Leahy Scale: Good     Standing balance support: During functional activity Standing balance-Leahy Scale: Fair                     ADL       Grooming: Wash/dry hands;Wash/dry face;Min Production designer, theatre/television/film: Minimal assistance;Ambulation;Comfort height toilet;RW           Functional mobility during ADLs: Minimal assistance;Rolling walker General ADL Comments: Pt agreeable to getting up with OT.  Moves slowly.        Vision                     Perception     Praxis      Cognition   Behavior During  Therapy: Flat affect Overall Cognitive Status: No family/caregiver present to determine baseline cognitive functioning                       Extremity/Trunk Assessment               Exercises     Shoulder Instructions       General Comments      Pertinent Vitals/ Pain       Pain Assessment: No/denies pain  Home Living                                          Prior Functioning/Environment              Frequency Min 2X/week     Progress Toward Goals  OT Goals(current goals can now be found in the care plan section)     ADL Goals Pt Will Perform Grooming: with supervision;standing Pt Will Perform Lower Body Dressing: with supervision;sit to/from stand Pt Will Transfer to Toilet: with supervision;ambulating Pt Will Perform Toileting - Clothing Manipulation and hygiene: with supervision;sit to/from stand  Plan Discharge plan remains appropriate    Co-evaluation  End of Session Equipment Utilized During Treatment: Rolling walker   Activity Tolerance Patient tolerated treatment well   Patient Left in chair;with call bell/phone within reach;with chair alarm set   Nurse Communication Mobility status        Time: 1610-9604 OT Time Calculation (min): 17 min  Charges: OT General Charges $OT Visit: 1 Procedure OT Treatments $Therapeutic Activity: 8-22 mins  Teigan Manner M 12/16/2013, 3:42 PM

## 2013-12-16 NOTE — Progress Notes (Signed)
PT Cancellation Note  Patient Details Name: Charles [name] MRN: [id number] DOB: 18-Feb-1941   Cancelled Treatment:    Reason Eval/Treat Not Completed: Fatigue/lethargy limiting ability to participate.  Pt simply stating "I just don't feel like getting up.".  Will try another time.     [name], Alison Murray 12/16/2013, 8:39 AM

## 2013-12-16 NOTE — Progress Notes (Signed)
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 CKD stage 5 -Have educated and vein mapping done.  I feel he would likely do better with initiation of dialysis in the hospital- have talked to [facility] about AV access and PC in the next couple of days- I appreciate their help, planning on placement tomorrow. I also have spoken with the patients grandson about this and he is in agreement. Cont iv furosemide. Pt appears to be a poor candidate for immunosuppressive therapy with altered mental status thought due to steroids and pancytopenia thought due to Cellcept. Prednisone reduced to  per day & Cellcept held 2. S/p fall with new L1 compression on MRI 3. Rectal bleeding- w/u per GI; for colonoscopy as an outpt  4. Anemia- bleeding and CKD- have added aranesp- s/p transfusion 5. Pancytopenia- worse off Cellcept but still feel is likely the cause- continue to hold- precautions as needed 6. Confusion- steroids vs uremia- prednisone being weaned- will decrease to 20 mg today. 7. HTN/vol- overloaded- BP is good - on high dose IV lasix for volume overload until HD to start tomorrow ?  Subjective: Interval History:  s/p vein mapping- somnolent, edema present-   Objective: Vital signs in last 24 hours: Temp:  [97.6 F (36.4 C)-99 F (37.2 C)] 99 F (37.2 C) (09/08 0534) Pulse Rate:  [68-85] 85 (09/08 0738) Resp:  [16-20] 19 (09/08 0738) BP: (107-134)/(56-66) 123/61 mmHg (09/08 0534) SpO2:  [95 %-100 %] 95 % (09/08 0738) Weight:  [82.146 kg (181 lb 1.6 oz)] 82.146 kg (181 lb 1.6 oz) (09/08 0600) Weight change: -0.68 kg (-1 lb 8 oz)  Intake/Output from previous day: 09/07 0701 - 09/08 0700 In: 363 [P.O.:360; I.V.:3] Out: 2575  [Urine:2575] Intake/Output this shift: Total I/O In: 120 [P.O.:120] Out: -   General appearance: alert, cooperative and flat affect Resp: clear to auscultation bilaterally Chest wall: no tenderness Cardio: regular rate and rhythm, S1, S2 normal, no murmur, click, rub or gallop Extremities: edema 2+ pitting  Lab Results:  Recent Labs  12/15/13 0513 12/16/13 0359  WBC 1.1* 1.0*  HGB 7.0* 7.9*  HCT 21.0* 22.9*  PLT 38* 35*   BMET:   Recent Labs  12/15/13 0513 12/16/13 0359  NA 133* 133*  K 4.2 4.1  CL 96 94*  CO2 20 23  GLUCOSE 106* 109*  BUN 106* 105*  CREATININE 4.56* 4.40*  CALCIUM 6.9* 6.7*   No results found for this basename: PTH,  in the last 72 hours Iron Studies: No results found for this basename: IRON, TIBC, TRANSFERRIN, FERRITIN,  in the last 72 hours Studies/Results: No results found.  Scheduled: . albuterol  2.5 mg Nebulization QPM  . atenolol  25 mg Oral Daily  . budesonide-formoterol  1 puff Inhalation Daily  . [START ON 12/17/2013]  ceFAZolin (ANCEF) IV  1 g Intravenous On Call  . darbepoetin (ARANESP) injection - NON-DIALYSIS  100 mcg Subcutaneous Q Mon-1800  . feeding supplement (NEPRO CARB STEADY)  237 mL Oral TID BM  . furosemide  160 mg Intravenous 3 times per day  . insulin aspart  0-9 Units Subcutaneous TID WC  . predniSONE  30 mg Oral Q breakfast  . sodium chloride  3 mL Intravenous Q12H  . tiotropium  18 mcg Inhalation Daily  LOS: 6 days   Sherrye Puga A 12/16/2013,9:18 AM

## 2013-12-16 NOTE — Progress Notes (Signed)
PROGRESS NOTE  Charles [name] ZOX:[id number] DOB: 07-Apr-1941 DOA: 12/10/2013 PCP: Aida Puffer, MD  Assessment/Plan: Anemia secondary to intermittent limited LGIB/cellcept -  hgb 7.9 after transfusion 1 unit prbc  Lupus nephritis: cellcept stopped due to pancytopenia. pred decreased to 20 mg. For diatek tomorrow. Permanent access when thrombocytopenia improved.  Pancytopenia: normal WBC 11/27/13 (from [facility]), hgb 9.5, platelet count 97K. Likely from cellcept, but will also d/c protonix for now. B12, folate ok. Discussed with Dr. Clelia Croft. No role for neupogen without fever/infection.  Occult blood positive stool - colonoscopy as outpatient. CT abd pelvis without cirrhosis  Compression fracture of L1 - norco PRN pain  Dementia vs uremia v. Prednisone effect haldol PRN. B12, folate ok. NH3, TSH ok  HTN: norvasc d/c'd due to borderline blood pressures low  Nephrotic syndrome. Weight on admission 197 lbs, 181 today with diuresis.   FTT: add nepro. Consult dietician. Likely from uremia  DM 2 with renal complications: add meal coverage novolog  Code Status: full Family Communication: left message with son Disposition Plan:    Consultants:  GI  Renal  VVS  Procedures:   HPI/Subjective: Felt "fluttering". EKG done at the time of sensation showed NSR per RN Objective: Filed Vitals:   12/16/13 1344  BP: 130/68  Pulse: 88  Temp: 98.7 F (37.1 C)  Resp: 19    Intake/Output Summary (Last 24 hours) at 12/16/13 1552 Last data filed at 12/16/13 1347  Gross per 24 hour  Intake    223 ml  Output   2775 ml  Net  -2552 ml   Filed Weights   12/14/13 0637 12/15/13 0624 12/16/13 0600  Weight: 85.3 kg (188 lb 0.8 oz) 82.827 kg (182 lb 9.6 oz) 82.146 kg (181 lb 1.6 oz)    Exam:   General:  Asleep, arousable, appropriate  Cardiovascular: rrr without MGR  Respiratory: clear without WRR  Abdomen: +Bs, soft  Musculoskeletal: 2+ edema. SCDs  Data Reviewed: Basic  Metabolic Panel:  Recent Labs Lab 12/12/13 0405 12/13/13 0407 12/14/13 0415 12/15/13 0513 12/16/13 0359  NA 136* 139 134* 133* 133*  K 5.2 4.7 4.8 4.2 4.1  CL 101 103 97 96 94*  CO2 18* GLUCOSE 187* 145* 134* 106* 109*  BUN 99* 105* 106* 106* 105*  CREATININE 4.51* 4.67* 4.62* 4.56* 4.40*  CALCIUM 6.9* 7.0* 7.1* 6.9* 6.7*  PHOS 6.3* 5.9* 5.4* 5.1* 4.7*   Liver Function Tests:  Recent Labs Lab 12/10/13 1510 12/12/13 0405 12/13/13 0407 12/14/13 0415 12/15/13 0513 12/16/13 0359  AST 14  --   --   --   --   --   ALT 21  --   --   --   --   --   ALKPHOS 65  --   --   --   --   --   BILITOT 0.3  --   --   --   --   --   PROT 4.6*  --   --   --   --   --   ALBUMIN 2.2* 2.0* 1.9* 2.0* 1.9* 1.9*    Recent Labs Lab 12/12/13 1520  LIPASE 95*    Recent Labs Lab 12/15/13 0513  AMMONIA 23   CBC:  Recent Labs Lab 12/11/13 0431 12/12/13 0405 12/14/13 0415 12/15/13 0513 12/16/13 0359  WBC 3.3* 3.4* 1.7* 1.1* 1.0*  NEUTROABS  --   --   --  0.7* 0.7*  HGB 7.0* 7.1* 7.5* 7.0* 7.9*  HCT 21.3* 21.7* 22.6* 21.0* 22.9*  MCV 84.5 82.2 83.7 83.3 81.8  PLT 65* 63* 49* 38* 35*   Cardiac Enzymes: No results found for this basename: CKTOTAL, CKMB, CKMBINDEX, TROPONINI,  in the last 168 hours BNP (last 3 results) No results found for this basename: PROBNP,  in the last 8760 hours CBG:  Recent Labs Lab 12/15/13 1630 12/15/13 2148 12/16/13 0623 12/16/13 1119 12/16/13 1120  GLUCAP 190* 169* 110* 219* 230*    No results found for this or any previous visit (from the past 240 hour(s)).   Studies: No results found.  EKG: NSR  Scheduled Meds: . albuterol  2.5 mg Nebulization QPM  . atenolol  25 mg Oral Daily  . budesonide-formoterol  1 puff Inhalation Daily  . [START ON 12/17/2013]  ceFAZolin (ANCEF) IV  2 g Intravenous On Call  . darbepoetin (ARANESP) injection - NON-DIALYSIS  100 mcg Subcutaneous Q Mon-1800  . feeding supplement (NEPRO CARB STEADY)   237 mL Oral TID BM  . furosemide  160 mg Intravenous 3 times per day  . insulin aspart  0-9 Units Subcutaneous TID WC  . [START ON 12/17/2013] predniSONE  20 mg Oral Q breakfast  . sodium chloride  3 mL Intravenous Q12H  . tiotropium  18 mcg Inhalation Daily   Continuous Infusions:  Antibiotics Given (last 72 hours)   None     Time spent: 25 min  Christiane Ha, MD  [facility] Pager [phone]. If 7PM-7AM, please contact night-coverage at [email or web address], password [facility] 12/16/2013, 3:52 PM  LOS: 6 days

## 2013-12-16 NOTE — Progress Notes (Signed)
12/16/2013 1650 Received call from patient's daughter [facility] stating that since patient was confused and pt. Daughter was not able to be present at this time, she gives verbal permission for her son Nahmir Zeidman (patient's grandson) to sign patient consent form for HD graft and Diatek placement. Mitzi Davenport states that patient currently lives with grandson Apolinar Junes) and he is in the process of obtaining HCPOA. Request acknowledged. Pt. Grandson at bedside and signed consent. Pt. Currently confused but states he is aware of the plans for surgery tomorrow.  Jamon Hayhurst, Blanchard Kelch

## 2013-12-16 NOTE — Consult Note (Signed)
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  He is on Atenolol for hypertension and Insulin for DM.  He denise hypercholesterolemia.  We have been asked to consult on him for diatek and permanent dialysis access.        Current Facility-Administered Medications  Medication Dose Route Frequency Provider Last Rate Last Dose  . albuterol (PROVENTIL) (2.5 MG/3ML) 0.083% nebulizer solution 2.5 mg  2.5 mg Nebulization QPM [name] U [name], DO   2.5 mg at 12/15/13 1958  . atenolol (TENORMIN) tablet 25 mg  25 mg Oral Daily Jared M Gardner, DO   25 mg at 12/15/13 0948  . budesonide-formoterol (SYMBICORT) 80-4.5 MCG/ACT inhaler 1 puff  1 puff Inhalation Daily Jared M Gardner, DO   1 puff at 12/15/13 0748  . darbepoetin (ARANESP) injection 100 mcg  100 mcg Subcutaneous Q Mon-1800 Kellie A Goldsborough, MD   100 mcg at 12/15/13 1707  . feeding supplement (NEPRO CARB STEADY) liquid 237 mL  237 mL Oral TID BM Corinna L Sullivan, MD   237 mL at 12/15/13 2018  . furosemide (LASIX) 160 mg in dextrose 5 % 50 mL IVPB  160 mg Intravenous 3 times per day Alvin C Powell, MD   160 mg at 12/16/13 0530  . haloperidol (HALDOL) tablet 1 mg  1 mg Oral Q8H PRN [name] U [name], DO      . HYDROcodone-acetaminophen (NORCO/VICODIN) 5-325 MG per tablet 1 tablet  1 tablet Oral Q6H PRN Jared M Gardner, DO   1 tablet at 12/15/13 0947  . insulin aspart (novoLOG) injection 0-9 Units  0-9 Units Subcutaneous TID WC Jared M Gardner, DO   2 Units at 12/15/13 1706  . predniSONE (DELTASONE) tablet 30 mg  30 mg Oral Q breakfast Alvin C Powell, MD   30 mg at 12/16/13 0606  . sodium chloride 0.9 % injection 3 mL  3 mL Intravenous Q12H Jared M Gardner, DO   3 mL at 12/15/13 2300  .  tiotropium (SPIRIVA) inhalation capsule 18 mcg  18 mcg Inhalation Daily Jared M Gardner, DO   18 mcg at 12/15/13 0748    Pt meds include: Statin :No Betablocker: Yes ASA: No Other anticoagulants/antiplatelets:   Past Medical History  Diagnosis Date  . Hypertension   . Hyperlipidemia   . Emphysema lung   . Heart failure   . Stage IV lupus nephritis (WHO)   . Anemia 11/2013    Past Surgical History  Procedure Laterality Date  . Cataract extraction    . Renal biopsy, percutaneous  11/2013    at [facility]    Social History History  Substance Use Topics  . Smoking status: Former Smoker  . Smokeless tobacco: Former User  . Alcohol Use: No    Family History Mother leukemia Bother lung cancer, CKD  No Known Allergies   REVIEW OF SYSTEMS  General: [ ] Weight loss, [ ] Fever, [ ] chills Neurologic: [ ] Dizziness, [ ] Blackouts, [ ] Seizure [ ] Stroke, [ ] "Mini stroke", [ ] Slurred speech, [ ] Temporary blindness; [ ] weakness in arms or legs, [ ] Hoarseness [ ] Dysphagia Cardiac: [ ] Chest pain/pressure, [x ] Shortness of breath at rest [ ] Shortness of   breath with exertion,  Atrial fibrillation or irregular heartbeat  Vascular:  Pain in legs with walking,  Pain in legs at rest,  Pain in legs at night,  Pitting edema   Non-healing ulcer,  Blood clot in vein/DVT,   Pulmonary:  Home oxygen,  Productive cough,  Coughing up blood,  Asthma,   Wheezing  COPD Musculoskeletal:   Arthritis,  Low back pain,  Joint pain Hematologic:  Easy Bruising, [x ] Anemia;  Hepatitis Gastrointestinal:  Blood in stool,  Gastroesophageal Reflux/heartburn, Urinary: [x ] chronic Kidney disease,  on HD -  MWF or  TTHS,  Burning with urination,  Difficulty urinating Skin:  Rashes,  Wounds Psychological:  Anxiety,  Depression  Physical Examination Filed Vitals:   12/15/13 1958 12/15/13 2027 12/16/13 0534 12/16/13 0600   BP:  134/66 123/61   Pulse:  78 85   Temp:  97.8 F (36.6 C) 99 F (37.2 C)   TempSrc:  Oral Oral   Resp:  20 20   Height:      Weight:    181 lb 1.6 oz (82.146 kg)  SpO2: 95% 100% 96%    Body mass index is 26.73 kg/(m^2).  General:  WDWN in NAD HENT: WNL Eyes: Pupils equal Pulmonary: normal non-labored breathing , without Rales, rhonchi,  wheezing Cardiac: RRR, without  Murmurs, rubs or gallops; No carotid bruits Abdomen: soft, NT, no masses Skin: no rashes, ulcers noted;  no Gangrene , no cellulitis; no open wounds;   Vascular Exam/Pulses:Palpable radial and brachial pulses bilateral upper extremities, Palpable femoral and DP bil.   Musculoskeletal: no muscle wasting or atrophy; positive BLE edema  Neurologic: A&O X 3; Appropriate Affect ;  SENSATION: normal; MOTOR FUNCTION: 4+/5 Symmetric Speech is fluent/normal   Significant Diagnostic Studies: CBC Lab Results  Component Value Date   WBC 1.0* 12/16/2013   HGB 7.9* 12/16/2013   HCT 22.9* 12/16/2013   MCV 81.8 12/16/2013   PLT 35* 12/16/2013    BMET    Component Value Date/Time   NA 133* 12/16/2013 0359   K 4.1 12/16/2013 0359   CL 94* 12/16/2013 0359   CO2 23 12/16/2013 0359   GLUCOSE 109* 12/16/2013 0359   BUN 105* 12/16/2013 0359   CREATININE 4.40* 12/16/2013 0359   CALCIUM 6.7* 12/16/2013 0359   GFRNONAA 12* 12/16/2013 0359   GFRAA 14* 12/16/2013 0359   Estimated Creatinine Clearance: 14.7 ml/min (by C-G formula based on Cr of 4.4).  COAG Lab Results  Component Value Date   INR 1.25 12/11/2013     Non-Invasive Vascular Imaging: vein mapping pending  ASSESSMENT/PLAN:  CKD, Lupus nephritis Pancytopenia  He is right hand dominant.  Once the vein mapping has been completed we will plan AV fistula verses AV graft for permanent access.  His platelets continue to be low at 35 and HGB 7.9, and WBC 1.0 today.   We will follow labs and plan diatek placement first 12/17/2013.  We will discuss this with Dr. Darrick Penna.   Clinton Gallant [facility] 12/16/2013 7:22 AM    Agree with above.  Need cell counts higher before permanent access.  Will place diatek tomorrow.  Save right arm for basilic vein fistula.  NPO p midnight Consent for diatek possible fistula  Fabienne Bruns, MD [facility] [place] Office: [phone] Pager: [phone]

## 2013-12-17 ENCOUNTER — Inpatient Hospital Stay (HOSPITAL_COMMUNITY): Payer: Medicare Other

## 2013-12-17 ENCOUNTER — Encounter (HOSPITAL_COMMUNITY): Payer: Medicare Other | Admitting: Anesthesiology

## 2013-12-17 ENCOUNTER — Encounter (HOSPITAL_COMMUNITY)
Admission: EM | Disposition: A | Discharge status: Home Health | Payer: Self-pay | Source: Home / Self Care | Attending: Internal Medicine

## 2013-12-17 ENCOUNTER — Inpatient Hospital Stay (HOSPITAL_COMMUNITY): Payer: Medicare Other | Admitting: Anesthesiology

## 2013-12-17 ENCOUNTER — Encounter (HOSPITAL_COMMUNITY): Payer: Self-pay | Admitting: Certified Registered Nurse Anesthetist

## 2013-12-17 DIAGNOSIS — N185 Chronic kidney disease, stage 5: Secondary | ICD-10-CM

## 2013-12-17 HISTORY — PX: INSERTION OF DIALYSIS CATHETER: SHX1324

## 2013-12-17 LAB — CBC WITH DIFFERENTIAL/PLATELET
BASOS ABS: 0 10*3/uL (ref 0.0–0.1)
Basophils Relative: 0 % (ref 0–1)
Eosinophils Absolute: 0 10*3/uL (ref 0.0–0.7)
Eosinophils Relative: 1 % (ref 0–5)
HCT: 22 % — ABNORMAL LOW (ref 39.0–52.0)
Hemoglobin: 7.2 g/dL — ABNORMAL LOW (ref 13.0–17.0)
Lymphocytes Relative: 45 % (ref 12–46)
Lymphs Abs: 0.3 10*3/uL — ABNORMAL LOW (ref 0.7–4.0)
MCH: 27.7 pg (ref 26.0–34.0)
MCHC: 32.7 g/dL (ref 30.0–36.0)
MCV: 84.6 fL (ref 78.0–100.0)
MONO ABS: 0 10*3/uL — AB (ref 0.1–1.0)
Monocytes Relative: 4 % (ref 3–12)
NEUTROS PCT: 50 % (ref 43–77)
Neutro Abs: 0.4 10*3/uL — ABNORMAL LOW (ref 1.7–7.7)
PLATELETS: 32 10*3/uL — AB (ref 150–400)
RBC: 2.6 MIL/uL — ABNORMAL LOW (ref 4.22–5.81)
RDW: 15.4 % (ref 11.5–15.5)
WBC Morphology: INCREASED
WBC: 0.7 10*3/uL — CL (ref 4.0–10.5)

## 2013-12-17 LAB — RENAL FUNCTION PANEL
Albumin: 1.8 g/dL — ABNORMAL LOW (ref 3.5–5.2)
Anion gap: 18 — ABNORMAL HIGH (ref 5–15)
BUN: 109 mg/dL — ABNORMAL HIGH (ref 6–23)
CO2: 22 mEq/L (ref 19–32)
Calcium: 7 mg/dL — ABNORMAL LOW (ref 8.4–10.5)
Chloride: 94 mEq/L — ABNORMAL LOW (ref 96–112)
Creatinine, Ser: 4.49 mg/dL — ABNORMAL HIGH (ref 0.50–1.35)
GFR, EST AFRICAN AMERICAN: 14 mL/min — AB (ref >90)
GFR, EST NON AFRICAN AMERICAN: 12 mL/min — AB (ref >90)
Glucose, Bld: 82 mg/dL (ref 70–99)
Phosphorus: 4.9 mg/dL — ABNORMAL HIGH (ref 2.3–4.6)
Potassium: 3.7 mEq/L (ref 3.7–5.3)
SODIUM: 134 meq/L — AB (ref 137–147)

## 2013-12-17 LAB — GLUCOSE, CAPILLARY
Glucose-Capillary: 93 mg/dL (ref 70–99)
Glucose-Capillary: 115 mg/dL — ABNORMAL HIGH (ref 70–99)
Glucose-Capillary: 157 mg/dL — ABNORMAL HIGH (ref 70–99)
Glucose-Capillary: 153 mg/dL — ABNORMAL HIGH (ref 70–99)
Glucose-Capillary: 135 mg/dL — ABNORMAL HIGH (ref 70–99)

## 2013-12-17 LAB — PARATHYROID HORMONE, INTACT (NO CA): PTH: 400 pg/mL — AB (ref 14–64)

## 2013-12-17 LAB — PREPARE RBC (CROSSMATCH)

## 2013-12-17 LAB — HEPATITIS B SURFACE ANTIGEN: HEP B S AG: NEGATIVE

## 2013-12-17 LAB — PROTIME-INR
INR: 1.01 (ref 0.00–1.49)
Prothrombin Time: 13.3 seconds (ref 11.6–15.2)

## 2013-12-17 LAB — APTT: aPTT: 30 seconds (ref 24–37)

## 2013-12-17 SURGERY — INSERTION OF DIALYSIS CATHETER
Anesthesia: Monitor Anesthesia Care

## 2013-12-17 MED ORDER — CEFAZOLIN SODIUM-DEXTROSE 2-3 GM-% IV SOLR
INTRAVENOUS | Status: AC
  Filled 2013-12-17: qty 50

## 2013-12-17 MED ORDER — FENTANYL CITRATE 0.05 MG/ML IJ SOLN
INTRAMUSCULAR | Status: DC | PRN
  Administered 2013-12-17 (×4): 25 ug via INTRAVENOUS

## 2013-12-17 MED ORDER — CEFAZOLIN SODIUM-DEXTROSE 2-3 GM-% IV SOLR
2.0000 g | Freq: Once | INTRAVENOUS | Status: AC
  Administered 2013-12-17: 2 g via INTRAVENOUS
  Filled 2013-12-17 (×2): qty 50

## 2013-12-17 MED ORDER — HEPARIN SODIUM (PORCINE) 1000 UNIT/ML DIALYSIS: 1000.0000 [IU] | INTRAMUSCULAR | Status: DC | PRN

## 2013-12-17 MED ORDER — RENA-VITE PO TABS
1.0000 | ORAL_TABLET | Freq: Every day | ORAL | Status: DC
  Administered 2013-12-17 – 2013-12-20 (×4): 1 via ORAL
  Filled 2013-12-17 (×4): qty 1

## 2013-12-17 MED ORDER — HEPARIN SODIUM (PORCINE) 5000 UNIT/ML IJ SOLN
INTRAMUSCULAR | Status: DC | PRN
  Administered 2013-12-17: 11:00:00

## 2013-12-17 MED ORDER — HEPARIN SODIUM (PORCINE) 1000 UNIT/ML IJ SOLN
INTRAMUSCULAR | Status: DC | PRN
  Administered 2013-12-17: 4.2 mL

## 2013-12-17 MED ORDER — NEPRO/CARBSTEADY PO LIQD: 237.0000 mL | ORAL | Status: DC | PRN

## 2013-12-17 MED ORDER — PROPOFOL 10 MG/ML IV BOLUS
INTRAVENOUS | Status: AC
  Filled 2013-12-17: qty 20

## 2013-12-17 MED ORDER — ALBUTEROL SULFATE (2.5 MG/3ML) 0.083% IN NEBU
2.5000 mg | INHALATION_SOLUTION | Freq: Once | RESPIRATORY_TRACT | Status: AC
  Administered 2013-12-17: 2.5 mg via RESPIRATORY_TRACT

## 2013-12-17 MED ORDER — ONDANSETRON HCL 4 MG/2ML IJ SOLN
INTRAMUSCULAR | Status: DC | PRN
  Administered 2013-12-17: 4 mg via INTRAVENOUS

## 2013-12-17 MED ORDER — SODIUM CHLORIDE 0.9 % IV SOLN
INTRAVENOUS | Status: DC | PRN
  Administered 2013-12-17: 11:00:00 via INTRAVENOUS

## 2013-12-17 MED ORDER — PENTAFLUOROPROP-TETRAFLUOROETH EX AERO: 1.0000 "application " | INHALATION_SPRAY | CUTANEOUS | Status: DC | PRN

## 2013-12-17 MED ORDER — CEFAZOLIN SODIUM-DEXTROSE 2-3 GM-% IV SOLR
INTRAVENOUS | Status: DC | PRN
  Administered 2013-12-17: 2 g via INTRAVENOUS

## 2013-12-17 MED ORDER — MIDAZOLAM HCL 2 MG/2ML IJ SOLN
INTRAMUSCULAR | Status: AC
  Filled 2013-12-17: qty 2

## 2013-12-17 MED ORDER — HEPARIN SODIUM (PORCINE) 1000 UNIT/ML DIALYSIS: 20.0000 [IU]/kg | INTRAMUSCULAR | Status: DC | PRN

## 2013-12-17 MED ORDER — LIDOCAINE HCL (PF) 1 % IJ SOLN: 5.0000 mL | INTRAMUSCULAR | Status: DC | PRN

## 2013-12-17 MED ORDER — PROPOFOL INFUSION 10 MG/ML OPTIME
INTRAVENOUS | Status: DC | PRN
  Administered 2013-12-17: 25 ug/kg/min via INTRAVENOUS

## 2013-12-17 MED ORDER — FENTANYL CITRATE 0.05 MG/ML IJ SOLN
INTRAMUSCULAR | Status: AC
  Filled 2013-12-17: qty 5

## 2013-12-17 MED ORDER — LIDOCAINE HCL (PF) 1 % IJ SOLN
INTRAMUSCULAR | Status: DC | PRN
  Administered 2013-12-17: 17 mL

## 2013-12-17 MED ORDER — ONDANSETRON HCL 4 MG/2ML IJ SOLN
INTRAMUSCULAR | Status: AC
  Filled 2013-12-17: qty 2

## 2013-12-17 MED ORDER — SODIUM CHLORIDE 0.9 % IV SOLN: 100.0000 mL | INTRAVENOUS | Status: DC | PRN

## 2013-12-17 MED ORDER — LIDOCAINE HCL (CARDIAC) 20 MG/ML IV SOLN
INTRAVENOUS | Status: AC
  Filled 2013-12-17: qty 5

## 2013-12-17 MED ORDER — PROMETHAZINE HCL 25 MG/ML IJ SOLN: 6.2500 mg | INTRAMUSCULAR | Status: DC | PRN

## 2013-12-17 MED ORDER — LIDOCAINE-EPINEPHRINE (PF) 1 %-1:200000 IJ SOLN
INTRAMUSCULAR | Status: AC
  Filled 2013-12-17: qty 10

## 2013-12-17 MED ORDER — ALTEPLASE 2 MG IJ SOLR: 2.0000 mg | Freq: Once | INTRAMUSCULAR | Status: DC | PRN

## 2013-12-17 MED ORDER — HEPARIN SODIUM (PORCINE) 1000 UNIT/ML IJ SOLN
INTRAMUSCULAR | Status: AC
  Filled 2013-12-17: qty 1

## 2013-12-17 MED ORDER — LIDOCAINE-PRILOCAINE 2.5-2.5 % EX CREA: 1.0000 "application " | TOPICAL_CREAM | CUTANEOUS | Status: DC | PRN

## 2013-12-17 MED ORDER — SODIUM CHLORIDE 0.9 % IV SOLN: Freq: Once | INTRAVENOUS | Status: DC

## 2013-12-17 MED ORDER — SODIUM CHLORIDE 0.9 % IV SOLN
INTRAVENOUS | Status: DC
  Administered 2013-12-17: 10 mL/h via INTRAVENOUS

## 2013-12-17 MED ORDER — FENTANYL CITRATE 0.05 MG/ML IJ SOLN: 25.0000 ug | INTRAMUSCULAR | Status: DC | PRN

## 2013-12-17 SURGICAL SUPPLY — 43 items
BAG BANDED W/RUBBER/TAPE 36X54 (MISCELLANEOUS) ×3 IMPLANT
BAG DECANTER FOR FLEXI CONT (MISCELLANEOUS) ×3 IMPLANT
CATH CANNON HEMO 15F 50CM (CATHETERS) IMPLANT
CATH CANNON HEMO 15FR 19 (HEMODIALYSIS SUPPLIES) ×3 IMPLANT
CATH CANNON HEMO 15FR 23CM (HEMODIALYSIS SUPPLIES) IMPLANT
CATH CANNON HEMO 15FR 31CM (HEMODIALYSIS SUPPLIES) IMPLANT
CATH CANNON HEMO 15FR 32CM (HEMODIALYSIS SUPPLIES) IMPLANT
COVER DOME SNAP 22 D (MISCELLANEOUS) ×3 IMPLANT
COVER PROBE W GEL 5X96 (DRAPES) ×3 IMPLANT
COVER SURGICAL LIGHT HANDLE (MISCELLANEOUS) ×3 IMPLANT
DECANTER SPIKE VIAL GLASS SM (MISCELLANEOUS) ×3 IMPLANT
DERMABOND ADVANCED (GAUZE/BANDAGES/DRESSINGS) ×2
DERMABOND ADVANCED .7 DNX12 (GAUZE/BANDAGES/DRESSINGS) ×1 IMPLANT
DRAPE C-ARM 42X72 X-RAY (DRAPES) IMPLANT
DRAPE CHEST BREAST 15X10 FENES (DRAPES) ×3 IMPLANT
GAUZE SPONGE 2X2 8PLY STRL LF (GAUZE/BANDAGES/DRESSINGS) ×1 IMPLANT
GAUZE SPONGE 4X4 16PLY XRAY LF (GAUZE/BANDAGES/DRESSINGS) ×3 IMPLANT
GLOVE SS BIOGEL STRL SZ 7 (GLOVE) ×1 IMPLANT
GLOVE SUPERSENSE BIOGEL SZ 7 (GLOVE) ×2
GOWN STRL REUS W/ TWL LRG LVL3 (GOWN DISPOSABLE) ×2 IMPLANT
GOWN STRL REUS W/TWL LRG LVL3 (GOWN DISPOSABLE) ×4
KIT BASIN OR (CUSTOM PROCEDURE TRAY) ×3 IMPLANT
KIT ROOM TURNOVER OR (KITS) ×3 IMPLANT
NEEDLE 18GX1X1/2 (RX/OR ONLY) (NEEDLE) ×3 IMPLANT
NEEDLE 22X1 1/2 (OR ONLY) (NEEDLE) ×6 IMPLANT
NEEDLE HYPO 25GX1X1/2 BEV (NEEDLE) ×3 IMPLANT
NS IRRIG 1000ML POUR BTL (IV SOLUTION) ×3 IMPLANT
PACK SURGICAL SETUP 50X90 (CUSTOM PROCEDURE TRAY) ×3 IMPLANT
PAD ARMBOARD 7.5X6 YLW CONV (MISCELLANEOUS) ×3 IMPLANT
SOAP 2 % CHG 4 OZ (WOUND CARE) ×3 IMPLANT
SPONGE GAUZE 2X2 STER 10/PKG (GAUZE/BANDAGES/DRESSINGS) ×2
SUT ETHILON 3 0 PS 1 (SUTURE) ×3 IMPLANT
SUT VICRYL 4-0 PS2 18IN ABS (SUTURE) ×3 IMPLANT
SYR 20CC LL (SYRINGE) ×2 IMPLANT
SYR 5ML LL (SYRINGE) ×6 IMPLANT
SYR CONTROL 10ML LL (SYRINGE) ×3 IMPLANT
SYRINGE 10CC LL (SYRINGE) ×2 IMPLANT
SYRINGE 12CC LL (MISCELLANEOUS) ×3 IMPLANT
SYRINGE 20CC LL (MISCELLANEOUS) ×3 IMPLANT
TAPE CLOTH SURG 4X10 WHT LF (GAUZE/BANDAGES/DRESSINGS) ×3 IMPLANT
TOWEL OR 17X24 6PK STRL BLUE (TOWEL DISPOSABLE) ×3 IMPLANT
TOWEL OR 17X26 10 PK STRL BLUE (TOWEL DISPOSABLE) ×3 IMPLANT
WATER STERILE IRR 1000ML POUR (IV SOLUTION) ×3 IMPLANT

## 2013-12-17 NOTE — Transfer of Care (Signed)
Immediate Anesthesia Transfer of Care Note  Patient: Charles [name]  Procedure(s) Performed: Procedure(s): INSERTION OF DIALYSIS CATHETER RIGHT INTERNAL JUGULAR VEIN (N/A)  Patient Location: PACU  Anesthesia Type:MAC  Level of Consciousness: awake, alert  and oriented  Airway & Oxygen Therapy: Patient Spontanous Breathing and Patient connected to nasal cannula oxygen  Post-op Assessment: Report given to PACU RN  Post vital signs: Reviewed and stable  Complications: No apparent anesthesia complications

## 2013-12-17 NOTE — Progress Notes (Signed)
PT Cancellation Note  Patient Details Name: DEANE WATTENBARGER MRN: [id number] DOB: 07-16-1940   Cancelled Treatment:    Reason Eval/Treat Not Completed: Patient at procedure or test/unavailable (Surgery for catheter and then to HD. )   INGOLD,Anelia Carriveau 12/17/2013, 12:28 PM [facility] [phone] (pager)

## 2013-12-17 NOTE — Progress Notes (Signed)
PROGRESS NOTE  Charles [name] ZOX:[id number] DOB: Jan 04, 1941 DOA: 12/10/2013 PCP: Aida Puffer, MD  Assessment/Plan: Anemia secondary to intermittent limited LGIB/cellcept/CKD -  hgb 7.2 today. Agree with a unit of blood  Lupus nephritis: cellcept stopped due to pancytopenia. pred decreased to 20 mg. Diatek placed today. Permanent access when thrombocytopenia improved.  Pancytopenia: normal WBC 11/27/13 (from [facility]), hgb 9.5, platelet count 97K. Likely from cellcept, but will also d/c protonix for now. B12, folate ok. Discussed with Dr. Clelia Croft. No role for neupogen without fever/infection.  May take some time to recover  Occult blood positive stool - colonoscopy as outpatient. CT abd pelvis without cirrhosis  Compression fracture of L1 - norco PRN pain  Dementia vs uremia v. Prednisone effect. B12, folate ok. NH3, TSH ok  HTN: norvasc d/c'd due to borderline blood pressures low  Nephrotic syndrome. Weight on admission 197 lbs, 165 today with diuresis.   FTT: add nepro. Consult dietician. Likely from uremia  DM 2 with renal complications: controlled  Code Status: full Family Communication: grandson 9/9 Disposition Plan:    Consultants:  GI  Renal  VVS  Procedures: diatek  HPI/Subjective: No complaints Objective: Filed Vitals:   12/17/13 1302  BP: 110/56  Pulse: 79  Temp: 98.8 F (37.1 C)  Resp: 19    Intake/Output Summary (Last 24 hours) at 12/17/13 1335 Last data filed at 12/17/13 1219  Gross per 24 hour  Intake    590 ml  Output   1756 ml  Net  -1166 ml   Filed Weights   12/15/13 0624 12/16/13 0600 12/17/13 0503  Weight: 82.827 kg (182 lb 9.6 oz) 82.146 kg (181 lb 1.6 oz) 75.2 kg (165 lb 12.6 oz)    Exam:   General:  Flat affect. comfortable  Cardiovascular: rrr without MGR  Respiratory: clear without WRR  Abdomen: +Bs, soft Musculoskeletal: 1+ edema. petechiae Data Reviewed: Basic Metabolic Panel:  Recent Labs Lab 12/13/13 0407  12/14/13 0415 12/15/13 0513 12/16/13 0359 12/17/13 0500  NA 139 134* 133* 133* 134*  K 4.7 4.8 4.2 4.1 3.7  CL 103 97 96 94* 94*  CO2 GLUCOSE 145* 134* 106* 109* 82  BUN 105* 106* 106* 105* 109*  CREATININE 4.67* 4.62* 4.56* 4.40* 4.49*  CALCIUM 7.0* 7.1* 6.9* 6.7* 7.0*  PHOS 5.9* 5.4* 5.1* 4.7* 4.9*   Liver Function Tests:  Recent Labs Lab 12/10/13 1510  12/13/13 0407 12/14/13 0415 12/15/13 0513 12/16/13 0359 12/17/13 0500  AST 14  --   --   --   --   --   --   ALT 21  --   --   --   --   --   --   ALKPHOS 65  --   --   --   --   --   --   BILITOT 0.3  --   --   --   --   --   --   PROT 4.6*  --   --   --   --   --   --   ALBUMIN 2.2*  < > 1.9* 2.0* 1.9* 1.9* 1.8*  < > = values in this interval not displayed.  Recent Labs Lab 12/12/13 1520  LIPASE 95*    Recent Labs Lab 12/15/13 0513  AMMONIA 23   CBC:  Recent Labs Lab 12/12/13 0405 12/14/13 0415 12/15/13 0513 12/16/13 0359 12/17/13 0500  WBC 3.4* 1.7* 1.1* 1.0* 0.7*  NEUTROABS  --   --  0.7* 0.7* 0.4*  HGB 7.1* 7.5* 7.0* 7.9* 7.2*  HCT 21.7* 22.6* 21.0* 22.9* 22.0*  MCV 82.2 83.7 83.3 81.8 84.6  PLT 63* 49* 38* 35* 32*   Cardiac Enzymes: No results found for this basename: CKTOTAL, CKMB, CKMBINDEX, TROPONINI,  in the last 168 hours BNP (last 3 results) No results found for this basename: PROBNP,  in the last 8760 hours CBG:  Recent Labs Lab 12/16/13 1611 12/16/13 2037 12/17/13 0623 12/17/13 0918 12/17/13 1333  GLUCAP 190* 99 93 115* 157*    No results found for this or any previous visit (from the past 240 hour(s)).   Studies: Dg Chest Port 1 View  12/17/2013   CLINICAL DATA:  Post Diatek catheter insertion  EXAM: PORTABLE CHEST - 1 VIEW  COMPARISON:  Portable exam 1235 hr compared to 12/26/2007  FINDINGS: RIGHT jugular dual-lumen central venous catheter with tip projecting over mid SVC.  Normal heart size, mediastinal contours, and pulmonary vascularity.  Lungs appear  hyperaerated but clear.  No infiltrate, pleural effusion, pneumothorax, or acute osseous findings.  IMPRESSION: No pneumothorax following catheter placement.   Electronically Signed   By: Ulyses Southward M.D.   On: 12/17/2013 13:01    EKG: NSR  Scheduled Meds: . sodium chloride   Intravenous Once  . albuterol  2.5 mg Nebulization QPM  . atenolol  25 mg Oral Daily  . budesonide-formoterol  1 puff Inhalation Daily  .  ceFAZolin (ANCEF) IV  2 g Intravenous Once  . darbepoetin (ARANESP) injection - NON-DIALYSIS  100 mcg Subcutaneous Q Mon-1800  . feeding supplement (NEPRO CARB STEADY)  237 mL Oral TID BM  . furosemide  160 mg Intravenous 3 times per day  . insulin aspart  0-9 Units Subcutaneous TID WC  . insulin aspart  5 Units Subcutaneous TID WC  . multivitamin  1 tablet Oral QHS  . predniSONE  20 mg Oral Q breakfast  . sodium chloride  3 mL Intravenous Q12H  . tiotropium  18 mcg Inhalation Daily   Continuous Infusions: . sodium chloride 10 mL/hr (12/17/13 1015)   Antibiotics Given (last 72 hours)   Date/Time Action Medication Dose Rate   12/17/13 0600 Given   ceFAZolin (ANCEF) IVPB 2 g/50 mL premix 2 g 100 mL/hr     Time spent: 15 min  Christiane Ha, MD  [facility] Pager [phone]. If 7PM-7AM, please contact night-coverage at [email or web address], password [facility] 12/17/2013, 1:35 PM  LOS: 7 days

## 2013-12-17 NOTE — Anesthesia Postprocedure Evaluation (Signed)
  Anesthesia Post-op Note  Patient: Charles [name]  Procedure(s) Performed: Procedure(s): INSERTION OF DIALYSIS CATHETER RIGHT INTERNAL JUGULAR VEIN (N/A)  Patient Location: PACU  Anesthesia Type:MAC  Level of Consciousness: awake, alert  and oriented  Airway and Oxygen Therapy: Patient Spontanous Breathing  Post-op Pain: none  Post-op Assessment: Post-op Vital signs reviewed  Post-op Vital Signs: Reviewed  Last Vitals:  Filed Vitals:   12/17/13 1625  BP: 141/80  Pulse: 74  Temp: 36.2 C  Resp:     Complications: No apparent anesthesia complications

## 2013-12-17 NOTE — Op Note (Signed)
OPERATIVE REPORT  Date of Surgery: 12/10/2013 - 12/17/2013  Surgeon: Josephina Gip, MD  Assistant: Nurse  Pre-op Diagnosis: ESRD (end stage renal disease)  Post-op Diagnosis: ESRD (end stage renal disease)  Procedure: Procedure(s): #1 bilateral old shunt localization in terms climb #2 insertion hemodialysis catheter right IJ-Diateck-19 cm  Anesthesia: MAC  EBL: Minimal  Complications: None  Procedure Details: Patient taken to the operating room    and placed in the Trendelenburg position. Both internal jugular veins were imaged using B. mode ultrasound and noted to be widely patent. After infiltration with 1% Xylocaine with epinephrine the right IJ was entered using ultrasound guidance. After dilating the track appropriately a 19 cm cuffed hemodialysis catheter was passed through a peel-away sheath positioned in the right atrium tunneled peripherally and secured with nylon sutures. The wound was closed with Vicryl in a subcuticular fashion with Dermabond patient taken to recovery in stable condition Josephina Gip, MD 12/17/2013 12:07 PM

## 2013-12-17 NOTE — Anesthesia Preprocedure Evaluation (Addendum)
Anesthesia Evaluation  Patient identified by MRN, date of birth, ID band Patient awake    Reviewed: Allergy & Precautions, H&P , NPO status , Patient's Chart, lab work & pertinent test results  Airway Mallampati: II TM Distance: >3 FB Neck ROM: Full    Dental  (+) Dental Advisory Given   Pulmonary COPD COPD inhaler, former smoker,    + wheezing      Cardiovascular hypertension, Rhythm:Regular Rate:Normal     Neuro/Psych negative neurological ROS  negative psych ROS   GI/Hepatic   Endo/Other  diabetes, Type 2, Insulin Dependent  Renal/GU CRFRenal diseaseK 3.7     Musculoskeletal negative musculoskeletal ROS (+)   Abdominal   Peds  Hematology  (+) anemia , Pancytopenia   Anesthesia Other Findings   Reproductive/Obstetrics negative OB ROS                          Anesthesia Physical Anesthesia Plan  ASA: III  Anesthesia Plan: MAC   Post-op Pain Management:    Induction: Intravenous  Airway Management Planned: Simple Face Mask  Additional Equipment: None  Intra-op Plan:   Post-operative Plan:   Informed Consent: I have reviewed the patients History and Physical, chart, labs and discussed the procedure including the risks, benefits and alternatives for the proposed anesthesia with the patient or authorized representative who has indicated his/her understanding and acceptance.   Dental advisory given  Plan Discussed with: CRNA, Anesthesiologist and Surgeon  Anesthesia Plan Comments:         Anesthesia Quick Evaluation

## 2013-12-17 NOTE — H&P (View-Only) (Signed)
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  He is on Atenolol for hypertension and Insulin for DM.  He denise hypercholesterolemia.  We have been asked to consult on him for diatek and permanent dialysis access.        Current Facility-Administered Medications  Medication Dose Route Frequency Provider Last Rate Last Dose  . albuterol (PROVENTIL) (2.5 MG/3ML) 0.083% nebulizer solution 2.5 mg  2.5 mg Nebulization QPM Joseph Art, DO   2.5 mg at 12/15/13 1958  . atenolol (TENORMIN) tablet 25 mg  25 mg Oral Daily Hillary Bow, DO   25 mg at 12/15/13 0948  . budesonide-formoterol (SYMBICORT) 80-4.5 MCG/ACT inhaler 1 puff  1 puff Inhalation Daily Hillary Bow, DO   1 puff at 12/15/13 0748  . darbepoetin (ARANESP) injection 100 mcg  100 mcg Subcutaneous Q Mon-1800 Cecille Aver, MD   100 mcg at 12/15/13 1707  . feeding supplement (NEPRO CARB STEADY) liquid 237 mL  237 mL Oral TID BM Christiane Ha, MD   237 mL at 12/15/13 2018  . furosemide (LASIX) 160 mg in dextrose 5 % 50 mL IVPB  160 mg Intravenous 3 times per day Lauris Poag, MD   160 mg at 12/16/13 0530  . haloperidol (HALDOL) tablet 1 mg  1 mg Oral Q8H PRN Joseph Art, DO      . HYDROcodone-acetaminophen (NORCO/VICODIN) 5-325 MG per tablet 1 tablet  1 tablet Oral Q6H PRN Hillary Bow, DO   1 tablet at 12/15/13 0947  . insulin aspart (novoLOG) injection 0-9 Units  0-9 Units Subcutaneous TID WC Hillary Bow, DO   2 Units at 12/15/13 1706  . predniSONE (DELTASONE) tablet 30 mg  30 mg Oral Q breakfast Lauris Poag, MD   30 mg at 12/16/13 0606  . sodium chloride 0.9 % injection 3 mL  3 mL Intravenous Q12H Hillary Bow, DO   3 mL at 12/15/13 2300  .  tiotropium (SPIRIVA) inhalation capsule 18 mcg  18 mcg Inhalation Daily Hillary Bow, DO   18 mcg at 12/15/13 0748    Pt meds include: Statin :No Betablocker: Yes ASA: No Other anticoagulants/antiplatelets:   Past Medical History  Diagnosis Date  . Hypertension   . Hyperlipidemia   . Emphysema lung   . Heart failure   . Stage IV lupus nephritis (WHO)   . Anemia 11/2013    Past Surgical History  Procedure Laterality Date  . Cataract extraction    . Renal biopsy, percutaneous  11/2013    at [facility]    Social History History  Substance Use Topics  . Smoking status: Former Games developer  . Smokeless tobacco: Former Neurosurgeon  . Alcohol Use: No    Family History Mother leukemia Bother lung cancer, CKD  No Known Allergies   REVIEW OF SYSTEMS  General:  Weight loss,  Fever,  chills Neurologic:  Dizziness,  Blackouts,  Seizure  Stroke,  "Mini stroke",  Slurred speech,  Temporary blindness;  weakness in arms or legs,  Hoarseness  Dysphagia Cardiac:  Chest pain/pressure, [x ] Shortness of breath at rest  Shortness of  breath with exertion,  Atrial fibrillation or irregular heartbeat  Vascular:  Pain in legs with walking,  Pain in legs at rest,  Pain in legs at night,  Pitting edema   Non-healing ulcer,  Blood clot in vein/DVT,   Pulmonary:  Home oxygen,  Productive cough,  Coughing up blood,  Asthma,   Wheezing  COPD Musculoskeletal:   Arthritis,  Low back pain,  Joint pain Hematologic:  Easy Bruising, [x ] Anemia;  Hepatitis Gastrointestinal:  Blood in stool,  Gastroesophageal Reflux/heartburn, Urinary: [x ] chronic Kidney disease,  on HD -  MWF or  TTHS,  Burning with urination,  Difficulty urinating Skin:  Rashes,  Wounds Psychological:  Anxiety,  Depression  Physical Examination Filed Vitals:   12/15/13 1958 12/15/13 2027 12/16/13 0534 12/16/13 0600   BP:  134/66 123/61   Pulse:  78 85   Temp:  97.8 F (36.6 C) 99 F (37.2 C)   TempSrc:  Oral Oral   Resp:  20 20   Height:      Weight:    181 lb 1.6 oz (82.146 kg)  SpO2: 95% 100% 96%    Body mass index is 26.73 kg/(m^2).  General:  WDWN in NAD HENT: WNL Eyes: Pupils equal Pulmonary: normal non-labored breathing , without Rales, rhonchi,  wheezing Cardiac: RRR, without  Murmurs, rubs or gallops; No carotid bruits Abdomen: soft, NT, no masses Skin: no rashes, ulcers noted;  no Gangrene , no cellulitis; no open wounds;   Vascular Exam/Pulses:Palpable radial and brachial pulses bilateral upper extremities, Palpable femoral and DP bil.   Musculoskeletal: no muscle wasting or atrophy; positive BLE edema  Neurologic: A&O X 3; Appropriate Affect ;  SENSATION: normal; MOTOR FUNCTION: 4+/5 Symmetric Speech is fluent/normal   Significant Diagnostic Studies: CBC Lab Results  Component Value Date   WBC 1.0* 12/16/2013   HGB 7.9* 12/16/2013   HCT 22.9* 12/16/2013   MCV 81.8 12/16/2013   PLT 35* 12/16/2013    BMET    Component Value Date/Time   NA 133* 12/16/2013 0359   K 4.1 12/16/2013 0359   CL 94* 12/16/2013 0359   CO2 23 12/16/2013 0359   GLUCOSE 109* 12/16/2013 0359   BUN 105* 12/16/2013 0359   CREATININE 4.40* 12/16/2013 0359   CALCIUM 6.7* 12/16/2013 0359   GFRNONAA 12* 12/16/2013 0359   GFRAA 14* 12/16/2013 0359   Estimated Creatinine Clearance: 14.7 ml/min (by C-G formula based on Cr of 4.4).  COAG Lab Results  Component Value Date   INR 1.25 12/11/2013     Non-Invasive Vascular Imaging: vein mapping pending  ASSESSMENT/PLAN:  CKD, Lupus nephritis Pancytopenia  He is right hand dominant.  Once the vein mapping has been completed we will plan AV fistula verses AV graft for permanent access.  His platelets continue to be low at 35 and HGB 7.9, and WBC 1.0 today.   We will follow labs and plan diatek placement first 12/17/2013.  We will discuss this with Dr. Darrick Penna.   Clinton Gallant [facility] 12/16/2013 7:22 AM    Agree with above.  Need cell counts higher before permanent access.  Will place diatek tomorrow.  Save right arm for basilic vein fistula.  NPO p midnight Consent for diatek possible fistula  Fabienne Bruns, MD [facility] [place] Office: [phone] Pager: [phone]

## 2013-12-17 NOTE — Progress Notes (Signed)
Subjective:  Currently in the OR getting PC- to get HD after- platelet count too low to get his BVT (in the 30's) Objective Vital signs in last 24 hours: Filed Vitals:   12/16/13 1344 12/16/13 2039 12/17/13 0503 12/17/13 0759  BP: 130/68 125/63 115/60   Pulse: 88 76 82 80  Temp: 98.7 F (37.1 C) 98 F (36.7 C) 98.4 F (36.9 C)   TempSrc:  Oral Oral   Resp: Height:      Weight:   75.2 kg (165 lb 12.6 oz)   SpO2: 98% 99% 96% 97%   Weight change: -6.947 kg (-15 lb 5 oz)  Intake/Output Summary (Last 24 hours) at 12/17/13 1024 Last data filed at 12/17/13 0920  Gross per 24 hour  Intake    340 ml  Output   2551 ml  Net  -2211 ml    Assessment/ Plan: Pt is a 73 y.o. 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 Renal- Biopsy showing significant disease process and lupus nephritis- has not been able to tolerate treatment for such and now with advanced CKD with GFR in the single digits and likely symptomatic.  Plan is now to call this ESRD and make plans for dialysis.  To get PC today followed by his first HD, second HD tomorrow- CLIP procedure.  He lives with grandson very near the [place].  To get BVT when platelet count recovers, not sure this will happen this hospitalization ? 2. Pancytopenia- felt due to cellcept.  It has been discontinued. Counts worsening still. Hosp has curbsided Hematology.  Will give transfusion today with HD 3. Fall- new L1 compression fracture  4. Secondary hyperparathyroidism- calcium /phos WNL- no binders needed PTH pending 5. HTN/volume- seems OK- no BP meds- is volume overloaded- third spacing- should improve with HD 6. Malnutrition- needs nepro and vitamin 7. Dispo- to be determined  Rocky Gladden A    Labs: Basic Metabolic Panel:  Recent Labs Lab  12/15/13 0513 12/16/13 0359 12/17/13 0500  NA 133* 133* 134*  K 4.2 4.1 3.7  CL 96 94* 94*  CO2 GLUCOSE 106* 109* 82  BUN 106* 105* 109*  CREATININE 4.56* 4.40* 4.49*  CALCIUM 6.9* 6.7* 7.0*  PHOS 5.1* 4.7* 4.9*   Liver Function Tests:  Recent Labs Lab 12/10/13 1510  12/15/13 0513 12/16/13 0359 12/17/13 0500  AST 14  --   --   --   --   ALT 21  --   --   --   --   ALKPHOS 65  --   --   --   --   BILITOT 0.3  --   --   --   --   PROT 4.6*  --   --   --   --   ALBUMIN 2.2*  < > 1.9* 1.9* 1.8*  < > = values in this interval not displayed.  Recent Labs Lab 12/12/13 1520  LIPASE 95*    Recent Labs Lab 12/15/13 0513  AMMONIA 23   CBC:  Recent Labs Lab 12/12/13 0405 12/14/13 0415 12/15/13 0513 12/16/13 0359 12/17/13 0500  WBC 3.4* 1.7* 1.1* 1.0* 0.7*  NEUTROABS  --   --  0.7* 0.7* 0.4*  HGB 7.1* 7.5* 7.0* 7.9* 7.2*  HCT 21.7* 22.6* 21.0* 22.9* 22.0*  MCV 82.2  83.7 83.3 81.8 84.6  PLT 63* 49* 38* 35* 32*   Cardiac Enzymes: No results found for this basename: CKTOTAL, CKMB, CKMBINDEX, TROPONINI,  in the last 168 hours CBG:  Recent Labs Lab 12/16/13 1120 12/16/13 1611 12/16/13 2037 12/17/13 0623 12/17/13 0918  GLUCAP 230* 190* 99 93 115*    Iron Studies:  Recent Labs  12/16/13 0359  IRON 77  TIBC 172*  FERRITIN 916*   Studies/Results: No results found. Medications: Infusions: . sodium chloride 10 mL/hr (12/17/13 1015)    Scheduled Medications: . [facility] HOLD] albuterol  2.5 mg Nebulization QPM  . [MAR HOLD] atenolol  25 mg Oral Daily  . [MAR HOLD] budesonide-formoterol  1 puff Inhalation Daily  . [MAR HOLD] darbepoetin (ARANESP) injection - NON-DIALYSIS  100 mcg Subcutaneous Q Mon-1800  . [MAR HOLD] feeding supplement (NEPRO CARB STEADY)  237 mL Oral TID BM  . [facility] HOLD] furosemide  160 mg Intravenous 3 times per day  . [MAR HOLD] insulin aspart  0-9 Units Subcutaneous TID WC  . [MAR HOLD] insulin aspart  5 Units Subcutaneous  TID WC  . [MAR HOLD] predniSONE  20 mg Oral Q breakfast  . [MAR HOLD] sodium chloride  3 mL Intravenous Q12H  . [MAR HOLD] tiotropium  18 mcg Inhalation Daily    have reviewed scheduled and prn medications.  Physical Exam: General: slow NAD Heart: RRR Lungs: mostly clear Abdomen: soft, non tender Extremities: pitting edema Dialysis Access: new PC    12/17/2013,10:24 AM  LOS: 7 days

## 2013-12-17 NOTE — Interval H&P Note (Signed)
History and Physical Interval Note:  12/17/2013 11:01 AM  Charles [name]  has presented today for surgery, with the diagnosis of ESRD (end stage renal disease)  The various methods of treatment have been discussed with the patient and family. After consideration of risks, benefits and other options for treatment, the patient has consented to  Procedure(s): INSERTION OF DIALYSIS CATHETER (N/A) as a surgical intervention .  The patient's history has been reviewed, patient examined, no change in status, stable for surgery.  I have reviewed the patient's chart and labs.  Questions were answered to the patient's satisfaction.     Josephina Gip

## 2013-12-18 ENCOUNTER — Encounter (HOSPITAL_COMMUNITY): Payer: Self-pay | Admitting: Vascular Surgery

## 2013-12-18 LAB — RENAL FUNCTION PANEL
Albumin: 1.9 g/dL — ABNORMAL LOW (ref 3.5–5.2)
Anion gap: 16 — ABNORMAL HIGH (ref 5–15)
BUN: 77 mg/dL — AB (ref 6–23)
CALCIUM: 7.3 mg/dL — AB (ref 8.4–10.5)
CO2: 25 mEq/L (ref 19–32)
Chloride: 95 mEq/L — ABNORMAL LOW (ref 96–112)
Creatinine, Ser: 3.87 mg/dL — ABNORMAL HIGH (ref 0.50–1.35)
GFR calc Af Amer: 16 mL/min — ABNORMAL LOW (ref >90)
GFR calc non Af Amer: 14 mL/min — ABNORMAL LOW (ref >90)
GLUCOSE: 103 mg/dL — AB (ref 70–99)
PHOSPHORUS: 4.4 mg/dL (ref 2.3–4.6)
Potassium: 3.9 mEq/L (ref 3.7–5.3)
SODIUM: 136 meq/L — AB (ref 137–147)

## 2013-12-18 LAB — CBC WITH DIFFERENTIAL/PLATELET
BASOS PCT: 2 % — AB (ref 0–1)
Basophils Absolute: 0 10*3/uL (ref 0.0–0.1)
EOS ABS: 0 10*3/uL (ref 0.0–0.7)
Eosinophils Relative: 2 % (ref 0–5)
HEMATOCRIT: 29.9 % — AB (ref 39.0–52.0)
Hemoglobin: 10.3 g/dL — ABNORMAL LOW (ref 13.0–17.0)
Lymphocytes Relative: 49 % — ABNORMAL HIGH (ref 12–46)
Lymphs Abs: 0.3 10*3/uL — ABNORMAL LOW (ref 0.7–4.0)
MCH: 28.5 pg (ref 26.0–34.0)
MCHC: 34.4 g/dL (ref 30.0–36.0)
MCV: 82.6 fL (ref 78.0–100.0)
Monocytes Absolute: 0 10*3/uL — ABNORMAL LOW (ref 0.1–1.0)
Monocytes Relative: 3 % (ref 3–12)
NEUTROS ABS: 0.3 10*3/uL — AB (ref 1.7–7.7)
Neutrophils Relative %: 44 % (ref 43–77)
Platelets: 24 10*3/uL — CL (ref 150–400)
RBC: 3.62 MIL/uL — ABNORMAL LOW (ref 4.22–5.81)
RDW: 15.2 % (ref 11.5–15.5)
WBC: 0.6 10*3/uL — CL (ref 4.0–10.5)

## 2013-12-18 LAB — TYPE AND SCREEN
ABO/RH(D): O POS
Antibody Screen: NEGATIVE
Unit division: 0
Unit division: 0
Unit division: 0

## 2013-12-18 LAB — GLUCOSE, CAPILLARY
GLUCOSE-CAPILLARY: 97 mg/dL (ref 70–99)
Glucose-Capillary: 103 mg/dL — ABNORMAL HIGH (ref 70–99)
Glucose-Capillary: 211 mg/dL — ABNORMAL HIGH (ref 70–99)
Glucose-Capillary: 113 mg/dL — ABNORMAL HIGH (ref 70–99)

## 2013-12-18 LAB — HEPATITIS B CORE ANTIBODY, TOTAL: Hep B Core Total Ab: REACTIVE — AB

## 2013-12-18 LAB — HEPATITIS B SURFACE ANTIBODY,QUALITATIVE: Hep B S Ab: NEGATIVE

## 2013-12-18 MED ORDER — DOXERCALCIFEROL 4 MCG/2ML IV SOLN
2.0000 ug | INTRAVENOUS | Status: DC
  Administered 2013-12-19: 2 ug via INTRAVENOUS
  Filled 2013-12-18: qty 2

## 2013-12-18 NOTE — Progress Notes (Signed)
Subjective:  S/P PC and first HD yesterday tolerated well- also transfused- counts are still low- looks better today Objective Vital signs in last 24 hours: Filed Vitals:   12/17/13 1625 12/17/13 1644 12/17/13 2100 12/18/13 0548  BP: 141/80 129/82 126/65 137/61  Pulse: 74 75 73 73  Temp: 97.2 F (36.2 C) 97.9 F (36.6 C) 98.1 F (36.7 C) 98.6 F (37 C)  TempSrc: Oral Oral Oral Oral  Resp:  16    Height:      Weight:  79.3 kg (174 lb 13.2 oz)  77.5 kg (170 lb 13.7 oz)  SpO2: 98% 99%  96%   Weight change: 5.3 kg (11 lb 11 oz)  Intake/Output Summary (Last 24 hours) at 12/18/13 0934 Last data filed at 12/17/13 2101  Gross per 24 hour  Intake   1260 ml  Output   2005 ml  Net   -745 ml    Assessment/ Plan: Pt is a 73 y.o. 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 Renal- Biopsy showing significant disease process and lupus nephritis- has not been able to tolerate treatment for such and now with advanced CKD with GFR in the single digits and likely symptomatic.  Plan is now to call this ESRD and make plans for dialysis.  S/P PC and first HD, second HD today- third tomorrow- CLIP procedure.  He lives with grandson very near the [place].  To get BVT when platelet count recovers, not sure this will happen this hospitalization ? 2. Pancytopenia- felt due to cellcept.  It has been discontinued. Counts worsening still. Hosp has curbsided Hematology.  Will give transfusion today with HD- thankfully no fever and no overt bleeding 3. Fall- new L1 compression fracture  4. Secondary hyperparathyroidism- calcium /phos WNL- no binders needed PTH 400- will start vitamin D 5. HTN/volume- seems OK- no BP meds- is volume overloaded- third spacing- should improve with HD- will stop lasix 6. Malnutrition- needs nepro and  vitamin 7. Dispo- to be determined- will CLIP to home address- will need counts better before discharge as well.  Marciano Mundt A    Labs: Basic Metabolic Panel:  Recent Labs Lab 12/16/13 0359 12/17/13 0500 12/18/13 0604  NA 133* 134* 136*  K 4.1 3.7 3.9  CL 94* 94* 95*  CO2 GLUCOSE 109* 82 103*  BUN 105* 109* 77*  CREATININE 4.40* 4.49* 3.87*  CALCIUM 6.7* 7.0* 7.3*  PHOS 4.7* 4.9* 4.4   Liver Function Tests:  Recent Labs Lab 12/16/13 0359 12/17/13 0500 12/18/13 0604  ALBUMIN 1.9* 1.8* 1.9*    Recent Labs Lab 12/12/13 1520  LIPASE 95*    Recent Labs Lab 12/15/13 0513  AMMONIA 23   CBC:  Recent Labs Lab 12/14/13 0415  12/15/13 0513 12/16/13 0359 12/17/13 0500 12/18/13 0604  WBC 1.7*  --  1.1* 1.0* 0.7* 0.6*  NEUTROABS  --   < > 0.7* 0.7* 0.4* PENDING  HGB 7.5*  --  7.0* 7.9* 7.2* 10.3*  HCT 22.6*  --  21.0* 22.9* 22.0* 29.9*  MCV 83.7  --  83.3 81.8 84.6 82.6  PLT 49*  --  38* 35* 32* 24*  < > = values in this interval not displayed. Cardiac Enzymes: No results found for this basename: CKTOTAL, CKMB, CKMBINDEX, TROPONINI,  in the last 168 hours CBG:  Recent Labs Lab  12/17/13 0918 12/17/13 1333 12/17/13 1711 12/17/13 2055 12/18/13 0611  GLUCAP 115* 157* 153* 135* 103*    Iron Studies:   Recent Labs  12/16/13 0359  IRON 77  TIBC 172*  FERRITIN 916*   Studies/Results: Dg Chest Port 1 View  12/17/2013   CLINICAL DATA:  Post Diatek catheter insertion  EXAM: PORTABLE CHEST - 1 VIEW  COMPARISON:  Portable exam 1235 hr compared to 12/26/2007  FINDINGS: RIGHT jugular dual-lumen central venous catheter with tip projecting over mid SVC.  Normal heart size, mediastinal contours, and pulmonary vascularity.  Lungs appear hyperaerated but clear.  No infiltrate, pleural effusion, pneumothorax, or acute osseous findings.  IMPRESSION: No pneumothorax following catheter placement.   Electronically Signed   By: Ulyses Southward M.D.   On:  12/17/2013 13:01   Medications: Infusions: . sodium chloride 10 mL/hr (12/17/13 1015)    Scheduled Medications: . sodium chloride   Intravenous Once  . albuterol  2.5 mg Nebulization QPM  . atenolol  25 mg Oral Daily  . budesonide-formoterol  1 puff Inhalation Daily  . darbepoetin (ARANESP) injection - NON-DIALYSIS  100 mcg Subcutaneous Q Mon-1800  . feeding supplement (NEPRO CARB STEADY)  237 mL Oral TID BM  . furosemide  160 mg Intravenous 3 times per day  . insulin aspart  0-9 Units Subcutaneous TID WC  . insulin aspart  5 Units Subcutaneous TID WC  . multivitamin  1 tablet Oral QHS  . predniSONE  20 mg Oral Q breakfast  . sodium chloride  3 mL Intravenous Q12H  . tiotropium  18 mcg Inhalation Daily    have reviewed scheduled and prn medications.  Physical Exam: General: looks better Heart: RRR Lungs: mostly clear Abdomen: soft, non tender Extremities: pitting edema Dialysis Access: new PC    12/18/2013,9:34 AM  LOS: 8 days

## 2013-12-18 NOTE — Progress Notes (Signed)
Dr.  Lendell Caprice paged about critical lab values. Louie Bun S 7:52 AM

## 2013-12-18 NOTE — Progress Notes (Signed)
PROGRESS NOTE  CHANSON TEEMS YQM:[id number] DOB: Dec 26, 1940 DOA: 12/10/2013 PCP: Aida Puffer, MD  Assessment/Plan: Anemia secondary to intermittent limited LGIB/cellcept/CKD -  hgb 10.3 after one unit pRBC  Lupus nephritis: cellcept stopped due to pancytopenia. pred decreased to 20 mg. Diatek placed and had HD yesterday. Permanent access when thrombocytopenia improved.  Pancytopenia: normal WBC 11/27/13 (from [facility]), hgb 9.5, platelet count 97K. Secondary to cellcept. Counts still droppoing. Discussed with Dr. Clelia Croft. No role for neupogen without fever/infection.  May take some time to recover. Would not discharge until counts nadir  Occult blood positive stool - colonoscopy as outpatient. CT abd pelvis without cirrhosis  Compression fracture of L1 - norco PRN pain  Dementia vs uremia v. Prednisone effect. B12, folate ok. NH3, TSH ok. No behavioral problems  HTN: norvasc d/c'd due to borderline blood pressures low  Nephrotic syndrome. Weight on admission 197 lbs, 175 today with diuresis.   FTT: add nepro. Consult dietician. Likely from uremia  DM 2 with renal complications: controlled  Code Status: full Family Communication: grandson 9/9 Disposition Plan: home with PT, OT when CBCs stabilize   Consultants:  GI  Renal  VVS  Procedures: diatek  HPI/Subjective: weak Objective: Filed Vitals:   12/18/13 0947  BP: 125/67  Pulse: 76  Temp:   Resp:     Intake/Output Summary (Last 24 hours) at 12/18/13 1040 Last data filed at 12/18/13 1000  Gross per 24 hour  Intake   1378 ml  Output   2005 ml  Net   -627 ml   Filed Weights   12/17/13 1436 12/17/13 1644 12/18/13 0548  Weight: 80.5 kg (177 lb 7.5 oz) 79.3 kg (174 lb 13.2 oz) 77.5 kg (170 lb 13.7 oz)    Exam:   General:  Flat affect. Comfortable in chair  Cardiovascular: rrr without MGR  Respiratory: clear without WRR  Abdomen: +Bs, soft Musculoskeletal: edema much improved. petechiae Data  Reviewed: Basic Metabolic Panel:  Recent Labs Lab 12/14/13 0415 12/15/13 0513 12/16/13 0359 12/17/13 0500 12/18/13 0604  NA 134* 133* 133* 134* 136*  K 4.8 4.2 4.1 3.7 3.9  CL 97 96 94* 94* 95*  CO2 GLUCOSE 134* 106* 109* 82 103*  BUN 106* 106* 105* 109* 77*  CREATININE 4.62* 4.56* 4.40* 4.49* 3.87*  CALCIUM 7.1* 6.9* 6.7* 7.0* 7.3*  PHOS 5.4* 5.1* 4.7* 4.9* 4.4   Liver Function Tests:  Recent Labs Lab 12/14/13 0415 12/15/13 0513 12/16/13 0359 12/17/13 0500 12/18/13 0604  ALBUMIN 2.0* 1.9* 1.9* 1.8* 1.9*    Recent Labs Lab 12/12/13 1520  LIPASE 95*    Recent Labs Lab 12/15/13 0513  AMMONIA 23   CBC:  Recent Labs Lab 12/14/13 0415 12/15/13 0513 12/16/13 0359 12/17/13 0500 12/18/13 0604  WBC 1.7* 1.1* 1.0* 0.7* 0.6*  NEUTROABS  --  0.7* 0.7* 0.4* 0.3*  HGB 7.5* 7.0* 7.9* 7.2* 10.3*  HCT 22.6* 21.0* 22.9* 22.0* 29.9*  MCV 83.7 83.3 81.8 84.6 82.6  PLT 49* 38* 35* 32* 24*   Cardiac Enzymes: No results found for this basename: CKTOTAL, CKMB, CKMBINDEX, TROPONINI,  in the last 168 hours BNP (last 3 results) No results found for this basename: PROBNP,  in the last 8760 hours CBG:  Recent Labs Lab 12/17/13 0918 12/17/13 1333 12/17/13 1711 12/17/13 2055 12/18/13 0611  GLUCAP 115* 157* 153* 135* 103*    No results found for this or any previous visit (from the past 240 hour(s)).   Studies: Dg  Chest Port 1 View  12/17/2013   CLINICAL DATA:  Post Diatek catheter insertion  EXAM: PORTABLE CHEST - 1 VIEW  COMPARISON:  Portable exam 1235 hr compared to 12/26/2007  FINDINGS: RIGHT jugular dual-lumen central venous catheter with tip projecting over mid SVC.  Normal heart size, mediastinal contours, and pulmonary vascularity.  Lungs appear hyperaerated but clear.  No infiltrate, pleural effusion, pneumothorax, or acute osseous findings.  IMPRESSION: No pneumothorax following catheter placement.   Electronically Signed   By: Ulyses Southward M.D.    On: 12/17/2013 13:01    EKG: NSR  Scheduled Meds: . sodium chloride   Intravenous Once  . albuterol  2.5 mg Nebulization QPM  . atenolol  25 mg Oral Daily  . budesonide-formoterol  1 puff Inhalation Daily  . darbepoetin (ARANESP) injection - NON-DIALYSIS  100 mcg Subcutaneous Q Mon-1800  . [START ON 12/19/2013] doxercalciferol  2 mcg Intravenous Q M,W,F-HD  . feeding supplement (NEPRO CARB STEADY)  237 mL Oral TID BM  . insulin aspart  0-9 Units Subcutaneous TID WC  . insulin aspart  5 Units Subcutaneous TID WC  . multivitamin  1 tablet Oral QHS  . predniSONE  20 mg Oral Q breakfast  . sodium chloride  3 mL Intravenous Q12H  . tiotropium  18 mcg Inhalation Daily   Continuous Infusions: . sodium chloride 10 mL/hr (12/17/13 1015)   Antibiotics Given (last 72 hours)   Date/Time Action Medication Dose Rate   12/17/13 0600 Given   ceFAZolin (ANCEF) IVPB 2 g/50 mL premix 2 g 100 mL/hr   12/17/13 1721 Given   ceFAZolin (ANCEF) IVPB 2 g/50 mL premix 2 g 100 mL/hr     Time spent: 15 min  Christiane Ha, MD  [facility] Pager [phone]. If 7PM-7AM, please contact night-coverage at [email or web address], password [facility] 12/18/2013, 10:40 AM  LOS: 8 days

## 2013-12-18 NOTE — Progress Notes (Signed)
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  Encouragement provided and pt informed of risks of immobility and need to increase activity, but continued to decline despite max effort to encourage him. DOE was 3/4 with 02 sats 96% and HR 86.  He has participated very minimally this this week due to medical issues and fatigue.  He is not meeting goals and am beginning to question if he will be able to return home at current level - may need SNF level rehab prior to returning home.   Follow Up Recommendations  SNF    Equipment Recommendations  None recommended by OT    Recommendations for Other Services      Precautions / Restrictions Precautions Precautions: Fall Precaution Comments: protective precautions        Mobility Bed Mobility               General bed mobility comments: sitting in recliner  Transfers                      Balance Overall balance assessment: Needs assistance Sitting-balance support: Feet supported Sitting balance-Leahy Scale: Good                             ADL                       Lower Body Dressing: Moderate assistance;Sit to/from stand                 General ADL Comments: Pt able to don/doff socks to practice LB ADLs, but adamantly refused further activity even with max encouragement provided.  He initially was agreeable, then stated "I don't care what you do, I am not going to do it". Explained risks of immobility to pt and the need to increase strength and  activity tolerance to no avail. He cites fatigue and need to rest as reason why he wouldn't participate further       Vision                     Perception     Praxis      Cognition   Behavior During Therapy: Flat affect Overall Cognitive Status: No family/caregiver present to determine baseline cognitive functioning                       Extremity/Trunk Assessment               Exercises     Shoulder Instructions       General Comments      Pertinent Vitals/ Pain       Pain Assessment: No/denies pain  Home Living                                          Prior Functioning/Environment  Frequency Min 2X/week     Progress Toward Goals  OT Goals(current goals can now be found in the care plan section)  Progress towards OT goals: Not progressing toward goals - comment (fatigue )  ADL Goals Pt Will Perform Grooming: with supervision;standing Pt Will Perform Lower Body Dressing: with supervision;sit to/from stand Pt Will Transfer to Toilet: with supervision;ambulating Pt Will Perform Toileting - Clothing Manipulation and hygiene: with supervision;sit to/from stand  Plan Discharge plan needs to be updated    Co-evaluation                 End of Session     Activity Tolerance Patient limited by fatigue   Patient Left in chair;with call bell/phone within reach;with chair alarm set   Nurse Communication          Time: 1610-9604 OT Time Calculation (min): 10 min  Charges: OT General Charges $OT Visit: 1 Procedure OT Treatments $Self Care/Home Management : 8-22 mins  Patriece [name] M 12/18/2013, 11:18 AM

## 2013-12-18 NOTE — Consult Note (Signed)
Still with pancytopenia.  Please reconsult when pancytopenia corrected to consider permanent access.  Will sign off.  Fabienne Bruns, MD [facility] Office: [phone] Pager: [phone]

## 2013-12-18 NOTE — Progress Notes (Signed)
PT Cancellation Note  Patient Details Name: Charles [name] MRN: [id number] DOB: 19-Aug-1940   Cancelled Treatment:    Reason Eval/Treat Not Completed: Patient declined, no reason specified.   Shavana Calder 12/18/2013, 4:03 PM

## 2013-12-19 DIAGNOSIS — N186 End stage renal disease: Secondary | ICD-10-CM

## 2013-12-19 DIAGNOSIS — Z992 Dependence on renal dialysis: Secondary | ICD-10-CM

## 2013-12-19 LAB — GLUCOSE, CAPILLARY
Glucose-Capillary: 105 mg/dL — ABNORMAL HIGH (ref 70–99)
Glucose-Capillary: 83 mg/dL (ref 70–99)
Glucose-Capillary: 163 mg/dL — ABNORMAL HIGH (ref 70–99)
Glucose-Capillary: 140 mg/dL — ABNORMAL HIGH (ref 70–99)

## 2013-12-19 LAB — CBC
HEMATOCRIT: 31.9 % — AB (ref 39.0–52.0)
HEMOGLOBIN: 10.5 g/dL — AB (ref 13.0–17.0)
MCH: 28.2 pg (ref 26.0–34.0)
MCHC: 32.9 g/dL (ref 30.0–36.0)
MCV: 85.5 fL (ref 78.0–100.0)
Platelets: 35 10*3/uL — ABNORMAL LOW (ref 150–400)
RBC: 3.73 MIL/uL — ABNORMAL LOW (ref 4.22–5.81)
RDW: 15.1 % (ref 11.5–15.5)
WBC: 0.6 10*3/uL — CL (ref 4.0–10.5)

## 2013-12-19 LAB — RENAL FUNCTION PANEL
ANION GAP: 15 (ref 5–15)
Albumin: 1.8 g/dL — ABNORMAL LOW (ref 3.5–5.2)
BUN: 53 mg/dL — ABNORMAL HIGH (ref 6–23)
CHLORIDE: 97 meq/L (ref 96–112)
CO2: 25 meq/L (ref 19–32)
Calcium: 7.4 mg/dL — ABNORMAL LOW (ref 8.4–10.5)
Creatinine, Ser: 3.05 mg/dL — ABNORMAL HIGH (ref 0.50–1.35)
GFR, EST AFRICAN AMERICAN: 22 mL/min — AB (ref >90)
GFR, EST NON AFRICAN AMERICAN: 19 mL/min — AB (ref >90)
Glucose, Bld: 112 mg/dL — ABNORMAL HIGH (ref 70–99)
Phosphorus: 3.2 mg/dL (ref 2.3–4.6)
Potassium: 3.8 mEq/L (ref 3.7–5.3)
SODIUM: 137 meq/L (ref 137–147)

## 2013-12-19 MED ORDER — ACETAMINOPHEN 325 MG PO TABS
650.0000 mg | ORAL_TABLET | Freq: Four times a day (QID) | ORAL | Status: DC | PRN
  Administered 2013-12-19 – 2013-12-20 (×2): 650 mg via ORAL
  Filled 2013-12-19 (×2): qty 2

## 2013-12-19 MED ORDER — DOXERCALCIFEROL 4 MCG/2ML IV SOLN
INTRAVENOUS | Status: AC
  Administered 2013-12-19: 2 ug via INTRAVENOUS
  Filled 2013-12-19: qty 2

## 2013-12-19 NOTE — Progress Notes (Signed)
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 Pt began HD on 12/17/13.    PT Comments    Pt much improved with mobility. Confirmed with pt that he has 24 hour supervision by grandson, CNA, and friend. With that support feel pt can return home with HHPT.  Follow Up Recommendations  Home health PT;Supervision/Assistance - 24 hour     Equipment Recommendations  None recommended by PT    Recommendations for Other Services       Precautions / Restrictions Precautions Precautions: Fall Precaution Comments: protective precautions  Restrictions Weight Bearing Restrictions: No    Mobility  Bed Mobility Overal bed mobility: Needs Assistance Bed Mobility: Rolling;Sidelying to Sit;Sit to Sidelying Rolling: Modified independent (Device/Increase time) Sidelying to sit: Modified independent (Device/Increase time);HOB elevated     Sit to sidelying: Min assist General bed mobility comments: Incr time to perform. Required assist to bring legs back up into bed when returning to sidelying.  Transfers Overall transfer level: Needs assistance Equipment used: Rolling walker (2 wheeled) Transfers: Sit to/from Stand Sit to Stand: Min guard         General transfer comment: Verbal cues for hand placement.  Ambulation/Gait Ambulation/Gait assistance: Min guard Ambulation Distance (Feet): 250 Feet Assistive device: Rolling walker (2 wheeled) Gait Pattern/deviations: Step-through pattern;Decreased stride length Gait velocity: decr Gait velocity interpretation: Below normal speed for age/gender General Gait Details: Verbal cues to stay closer to walker and keep feet within  walker base on turns.   Stairs            Wheelchair Mobility    Modified Rankin (Stroke Patients Only)       Balance Overall balance assessment: Needs assistance Sitting-balance support: No upper extremity supported;Feet supported Sitting balance-Leahy Scale: Good     Standing balance support: No upper extremity supported Standing balance-Leahy Scale: Fair                      Cognition Arousal/Alertness: Awake/alert Behavior During Therapy: WFL for tasks assessed/performed Overall Cognitive Status: No family/caregiver present to determine baseline cognitive functioning       Memory: Decreased short-term memory              Exercises      General Comments        Pertinent Vitals/Pain Pain Assessment: No/denies pain    Home Living                      Prior Function            PT Goals (current goals can now be found in the care plan section) Progress towards PT goals: Progressing toward goals    Frequency  Min 3X/week    PT Plan Current plan remains appropriate    Co-evaluation             End of Session Equipment Utilized During Treatment: Gait belt Activity Tolerance: Patient tolerated treatment well Patient left: in bed;with call bell/phone within reach;with bed alarm set     Time: 4098-1191 PT Time Calculation (min): 23 min  Charges:  $Gait Training: 23-37 mins  G Codes:      Charles [name] 12/19/2013, 11:02 AM  Charles [name] PT [phone]

## 2013-12-19 NOTE — Procedures (Signed)
Patient was seen on dialysis and the procedure was supervised.  BFR 300  Via PC BP is  109/73.   Patient appears to be tolerating treatment well  Charles [name] A 12/19/2013

## 2013-12-19 NOTE — Progress Notes (Signed)
Subjective:  S/P PC and first HD yesterday tolerated well- also transfused- counts are still low- looks better today- wants to go home Objective Vital signs in last 24 hours: Filed Vitals:   12/18/13 1800 12/18/13 1822 12/18/13 2026 12/19/13 0650  BP: 114/82 125/75 126/63 128/61  Pulse: 72 73 73 95  Temp:  97.4 F (36.3 C) 98 F (36.7 C) 99.1 F (37.3 C)  TempSrc:  Oral Oral Oral  Resp:  Height:      Weight:  78.9 kg (173 lb 15.1 oz)  78.6 kg (173 lb 4.5 oz)  SpO2:  95% 97% 93%   Weight change: -0.7 kg (-1 lb 8.7 oz)  Intake/Output Summary (Last 24 hours) at 12/19/13 1047 Last data filed at 12/18/13 1822  Gross per 24 hour  Intake    120 ml  Output    900 ml  Net   -780 ml    Assessment/ Plan: Pt is [name] 73 y.o. 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 Renal- Biopsy showing significant disease process and lupus nephritis- has not been able to tolerate treatment for such and now with advanced CKD with GFR in the single digits and likely symptomatic.  Plan is now to call this ESRD and make plans for dialysis.  S/P PC and first HD 9/10, second HD 9/11- third today- CLIP procedure.  He lives with grandson very near the [place].  To get BVT when platelet count recovers, not sure this will happen this hospitalization ? 2. Pancytopenia- felt due to cellcept.  It has been discontinued. Counts worsening still. Hosp has curbsided Hematology.  Will give transfusion today with HD- thankfully no fever and no overt bleeding 3. Fall- new L1 compression fracture  4. Secondary hyperparathyroidism- calcium /phos WNL- no binders needed PTH 400- have started vitamin D 5. HTN/volume- seems OK- no BP meds- is volume overloaded- third spacing- should improve with HD-  6. Malnutrition- needs nepro and vitamin 7. Dispo-  to be determined- will CLIP to home address- will need counts better before discharge as well.  Charles [name]    Labs: Basic Metabolic Panel:  Recent Labs Lab 12/17/13 0500 12/18/13 0604 12/19/13 0500  NA 134* 136* 137  K 3.7 3.9 3.8  CL 94* 95* 97  CO2 GLUCOSE 82 103* 112*  BUN 109* 77* 53*  CREATININE 4.49* 3.87* 3.05*  CALCIUM 7.0* 7.3* 7.4*  PHOS 4.9* 4.4 3.2   Liver Function Tests:  Recent Labs Lab 12/17/13 0500 12/18/13 0604 12/19/13 0500  ALBUMIN 1.8* 1.9* 1.8*    Recent Labs Lab 12/12/13 1520  LIPASE 95*    Recent Labs Lab 12/15/13 0513  AMMONIA 23   CBC:  Recent Labs Lab 12/14/13 0415  12/15/13 0513 12/16/13 0359 12/17/13 0500 12/18/13 0604  WBC 1.7*  --  1.1* 1.0* 0.7* 0.6*  NEUTROABS  --   < > 0.7* 0.7* 0.4* 0.3*  HGB 7.5*  --  7.0* 7.9* 7.2* 10.3*  HCT 22.6*  --  21.0* 22.9* 22.0* 29.9*  MCV 83.7  --  83.3 81.8 84.6 82.6  PLT 49*  --  38* 35* 32* 24*  < > = values in this interval not displayed. Cardiac Enzymes: No results found for this basename: CKTOTAL, CKMB, CKMBINDEX, TROPONINI,  in the last 168 hours CBG:  Recent  Labs Lab 12/18/13 0611 12/18/13 1126 12/18/13 1821 12/18/13 2148 12/19/13 0648  GLUCAP 103* 211* 97 113* 105*    Iron Studies:  No results found for this basename: IRON, TIBC, TRANSFERRIN, FERRITIN,  in the last 72 hours Studies/Results: Dg Chest Port 1 View  12/17/2013   CLINICAL DATA:  Post Diatek catheter insertion  EXAM: PORTABLE CHEST - 1 VIEW  COMPARISON:  Portable exam 1235 hr compared to 12/26/2007  FINDINGS: RIGHT jugular dual-lumen central venous catheter with tip projecting over mid SVC.  Normal heart size, mediastinal contours, and pulmonary vascularity.  Lungs appear hyperaerated but clear.  No infiltrate, pleural effusion, pneumothorax, or acute osseous findings.  IMPRESSION: No pneumothorax following catheter placement.   Electronically Signed   By: Ulyses Southward M.D.   On:  12/17/2013 13:01   Medications: Infusions: . sodium chloride 10 mL/hr (12/17/13 1015)    Scheduled Medications: . sodium chloride   Intravenous Once  . albuterol  2.5 mg Nebulization QPM  . atenolol  25 mg Oral Daily  . budesonide-formoterol  1 puff Inhalation Daily  . darbepoetin (ARANESP) injection - NON-DIALYSIS  100 mcg Subcutaneous Q Mon-1800  . doxercalciferol  2 mcg Intravenous Q M,W,F-HD  . feeding supplement (NEPRO CARB STEADY)  237 mL Oral TID BM  . insulin aspart  0-9 Units Subcutaneous TID WC  . insulin aspart  5 Units Subcutaneous TID WC  . multivitamin  1 tablet Oral QHS  . predniSONE  20 mg Oral Q breakfast  . sodium chloride  3 mL Intravenous Q12H  . tiotropium  18 mcg Inhalation Daily    have reviewed scheduled and prn medications.  Physical Exam: General: looks better Heart: RRR Lungs: mostly clear Abdomen: soft, non tender Extremities: pitting edema Dialysis Access: new PC    12/19/2013,10:47 AM  LOS: 9 days

## 2013-12-19 NOTE — Progress Notes (Signed)
Spoke with Reesa Chew HD Secretary regarding CLIP process: Patient has been assigned to [facility]. He will begin Monday Sept 14, 2015. He will have Monday, Wednesday, Friday Second Shift Schedule. Patient is to arrive at 1115 on Sept 14, 2015 to complete admission paperwork.  Onedia Vargus, Charlyne Quale

## 2013-12-19 NOTE — Progress Notes (Signed)
PROGRESS NOTE  Charles [name] FAO:[id number] DOB: 1941/03/07 DOA: 12/10/2013 PCP: Charles Puffer, MD  Assessment/Plan: Anemia secondary to intermittent limited LGIB/cellcept/CKD -  hgb 10.3 after one unit pRBC  Lupus nephritis: cellcept stopped due to pancytopenia. pred decreased to 20 mg. Diatek placed and had HD yesterday. Permanent access when thrombocytopenia improved.  Pancytopenia: normal WBC 11/27/13 (from [facility]), hgb 9.5, platelet count 97K. Secondary to cellcept. Counts still droppoing. Dr. Lendell [name] discussed with Dr. Clelia [name]. No role for neupogen without fever/infection.  May take some time to recover. Would not discharge until counts nadir  Occult blood positive stool - colonoscopy as outpatient. CT abd pelvis without cirrhosis  Compression fracture of L1 - norco PRN pain  Dementia vs uremia v. Prednisone effect. B12, folate ok. NH3, TSH ok. No behavioral problems  HTN: norvasc d/c'd due to borderline blood pressures low  Nephrotic syndrome. Weight on admission 197 lbs, 175 today with diuresis.   FTT: add nepro. Consult dietician. Likely from uremia  DM 2 with renal complications: controlled  Code Status: full Family Communication: grandson 9/9 Disposition Plan: home with PT, OT when CBCs stabilize   Consultants:  GI  Renal  VVS  Procedures: diatek  HPI/Subjective: Anxious to go home  Objective: Filed Vitals:   12/19/13 0650  BP: 128/61  Pulse: 95  Temp: 99.1 F (37.3 C)  Resp: 18    Intake/Output Summary (Last 24 hours) at 12/19/13 1014 Last data filed at 12/18/13 1822  Gross per 24 hour  Intake    120 ml  Output    900 ml  Net   -780 ml   Filed Weights   12/18/13 1522 12/18/13 1822 12/19/13 0650  Weight: 79.8 kg (175 lb 14.8 oz) 78.9 kg (173 lb 15.1 oz) 78.6 kg (173 lb 4.5 oz)    Exam:   General:  Flat affect. Comfortable in chair  Cardiovascular: rrr without MGR  Respiratory: clear without WRR  Abdomen: +Bs, soft Musculoskeletal:  edema much improved. Petechiae   Data Reviewed: Basic Metabolic Panel:  Recent Labs Lab 12/15/13 0513 12/16/13 0359 12/17/13 0500 12/18/13 0604 12/19/13 0500  NA 133* 133* 134* 136* 137  K 4.2 4.1 3.7 3.9 3.8  CL 96 94* 94* 95* 97  CO2 GLUCOSE 106* 109* 82 103* 112*  BUN 106* 105* 109* 77* 53*  CREATININE 4.56* 4.40* 4.49* 3.87* 3.05*  CALCIUM 6.9* 6.7* 7.0* 7.3* 7.4*  PHOS 5.1* 4.7* 4.9* 4.4 3.2   Liver Function Tests:  Recent Labs Lab 12/15/13 0513 12/16/13 0359 12/17/13 0500 12/18/13 0604 12/19/13 0500  ALBUMIN 1.9* 1.9* 1.8* 1.9* 1.8*    Recent Labs Lab 12/12/13 1520  LIPASE 95*    Recent Labs Lab 12/15/13 0513  AMMONIA 23   CBC:  Recent Labs Lab 12/14/13 0415 12/15/13 0513 12/16/13 0359 12/17/13 0500 12/18/13 0604  WBC 1.7* 1.1* 1.0* 0.7* 0.6*  NEUTROABS  --  0.7* 0.7* 0.4* 0.3*  HGB 7.5* 7.0* 7.9* 7.2* 10.3*  HCT 22.6* 21.0* 22.9* 22.0* 29.9*  MCV 83.7 83.3 81.8 84.6 82.6  PLT 49* 38* 35* 32* 24*   Cardiac Enzymes: No results found for this basename: CKTOTAL, CKMB, CKMBINDEX, TROPONINI,  in the last 168 hours BNP (last 3 results) No results found for this basename: PROBNP,  in the last 8760 hours CBG:  Recent Labs Lab 12/18/13 0611 12/18/13 1126 12/18/13 1821 12/18/13 2148 12/19/13 0648  GLUCAP 103* 211* 97 113* 105*    No results found for this  or any previous visit (from the past 240 hour(s)).   Studies: Dg Chest Port 1 View  12/17/2013   CLINICAL DATA:  Post Diatek catheter insertion  EXAM: PORTABLE CHEST - 1 VIEW  COMPARISON:  Portable exam 1235 hr compared to 12/26/2007  FINDINGS: RIGHT jugular dual-lumen central venous catheter with tip projecting over mid SVC.  Normal heart size, mediastinal contours, and pulmonary vascularity.  Lungs appear hyperaerated but clear.  No infiltrate, pleural effusion, pneumothorax, or acute osseous findings.  IMPRESSION: No pneumothorax following catheter placement.    Electronically Signed   By: Charles [name] M.D.   On: 12/17/2013 13:01    EKG: NSR  Scheduled Meds: . sodium chloride   Intravenous Once  . albuterol  2.5 mg Nebulization QPM  . atenolol  25 mg Oral Daily  . budesonide-formoterol  1 puff Inhalation Daily  . darbepoetin (ARANESP) injection - NON-DIALYSIS  100 mcg Subcutaneous Q Mon-1800  . doxercalciferol  2 mcg Intravenous Q M,W,F-HD  . feeding supplement (NEPRO CARB STEADY)  237 mL Oral TID BM  . insulin aspart  0-9 Units Subcutaneous TID WC  . insulin aspart  5 Units Subcutaneous TID WC  . multivitamin  1 tablet Oral QHS  . predniSONE  20 mg Oral Q breakfast  . sodium chloride  3 mL Intravenous Q12H  . tiotropium  18 mcg Inhalation Daily   Continuous Infusions: . sodium chloride 10 mL/hr (12/17/13 1015)   Antibiotics Given (last 72 hours)   Date/Time Action Medication Dose Rate   12/17/13 0600 Given   ceFAZolin (ANCEF) IVPB 2 g/50 mL premix 2 g 100 mL/hr   12/17/13 1721 Given   ceFAZolin (ANCEF) IVPB 2 g/50 mL premix 2 g 100 mL/hr     Time spent: 15 min  Charles Tiano, DO  [facility] Pager [phone]. If 7PM-7AM, please contact night-coverage at [email or web address], password [facility] 12/19/2013, 10:14 AM  LOS: 9 days

## 2013-12-20 ENCOUNTER — Inpatient Hospital Stay (HOSPITAL_COMMUNITY): Payer: Medicare Other

## 2013-12-20 DIAGNOSIS — I10 Essential (primary) hypertension: Secondary | ICD-10-CM

## 2013-12-20 LAB — CBC WITH DIFFERENTIAL/PLATELET
BASOS ABS: 0 10*3/uL (ref 0.0–0.1)
Basophils Relative: 1 % (ref 0–1)
EOS ABS: 0 10*3/uL (ref 0.0–0.7)
EOS PCT: 3 % (ref 0–5)
HEMATOCRIT: 31.2 % — AB (ref 39.0–52.0)
Hemoglobin: 10.3 g/dL — ABNORMAL LOW (ref 13.0–17.0)
LYMPHS PCT: 43 % (ref 12–46)
Lymphs Abs: 0.3 10*3/uL — ABNORMAL LOW (ref 0.7–4.0)
MCH: 28.2 pg (ref 26.0–34.0)
MCHC: 33 g/dL (ref 30.0–36.0)
MCV: 85.5 fL (ref 78.0–100.0)
MONO ABS: 0.1 10*3/uL (ref 0.1–1.0)
Monocytes Relative: 16 % — ABNORMAL HIGH (ref 3–12)
Neutro Abs: 0.3 10*3/uL — ABNORMAL LOW (ref 1.7–7.7)
Neutrophils Relative %: 37 % — ABNORMAL LOW (ref 43–77)
Platelets: 43 10*3/uL — ABNORMAL LOW (ref 150–400)
RBC: 3.65 MIL/uL — ABNORMAL LOW (ref 4.22–5.81)
RDW: 15.1 % (ref 11.5–15.5)
WBC: 0.7 10*3/uL — AB (ref 4.0–10.5)

## 2013-12-20 LAB — RENAL FUNCTION PANEL
ALBUMIN: 1.8 g/dL — AB (ref 3.5–5.2)
Anion gap: 13 (ref 5–15)
BUN: 28 mg/dL — AB (ref 6–23)
CO2: 26 mEq/L (ref 19–32)
Calcium: 7.9 mg/dL — ABNORMAL LOW (ref 8.4–10.5)
Chloride: 99 mEq/L (ref 96–112)
Creatinine, Ser: 2.58 mg/dL — ABNORMAL HIGH (ref 0.50–1.35)
GFR calc Af Amer: 27 mL/min — ABNORMAL LOW (ref >90)
GFR calc non Af Amer: 23 mL/min — ABNORMAL LOW (ref >90)
Glucose, Bld: 140 mg/dL — ABNORMAL HIGH (ref 70–99)
POTASSIUM: 4.2 meq/L (ref 3.7–5.3)
Phosphorus: 2.4 mg/dL (ref 2.3–4.6)
Sodium: 138 mEq/L (ref 137–147)

## 2013-12-20 LAB — GLUCOSE, CAPILLARY
Glucose-Capillary: 151 mg/dL — ABNORMAL HIGH (ref 70–99)
Glucose-Capillary: 86 mg/dL (ref 70–99)
Glucose-Capillary: 146 mg/dL — ABNORMAL HIGH (ref 70–99)
Glucose-Capillary: 105 mg/dL — ABNORMAL HIGH (ref 70–99)

## 2013-12-20 MED ORDER — PREDNISONE 10 MG PO TABS
10.0000 mg | ORAL_TABLET | Freq: Every day | ORAL | Status: DC
  Administered 2013-12-21: 10 mg via ORAL
  Filled 2013-12-20 (×2): qty 1

## 2013-12-20 NOTE — Progress Notes (Signed)
PROGRESS NOTE  Charles [name]:[id number] DOB: Aug 30, 1940 DOA: 12/10/2013 PCP: Aida Puffer, MD  Assessment/Plan: Anemia secondary to intermittent limited LGIB/cellcept/CKD -  hgb 10.3 after one unit pRBC  Lupus nephritis: cellcept stopped due to pancytopenia. pred decreased to 20 mg. Diatek placed and had HD yesterday. Permanent access when thrombocytopenia improved.  Pancytopenia: normal WBC 11/27/13 (from [facility]), hgb 9.5, platelet count 97K. Secondary to cellcept. Counts still droppoing. Dr. Lendell Caprice discussed with Dr. Clelia Croft. No role for neupogen without fever/infection.  May take some time to recover. Would not discharge until counts nadir Low grade fever- added incentive spirometry -chest x ray negative for PNA- monitor if fever continuous will do further work up -continue to monitor  Occult blood positive stool - colonoscopy as outpatient. CT abd pelvis without cirrhosis  Compression fracture of L1 - norco PRN pain  Dementia vs uremia v. Prednisone effect. B12, folate ok. NH3, TSH ok. No behavioral problems  HTN: norvasc d/c'd due to borderline blood pressures low  Nephrotic syndrome. Weight on admission 197 lbs, 175 today with diuresis.   FTT: add nepro. Consult dietician. Likely from uremia  DM 2 with renal complications: controlled  Code Status: full Family Communication: LM grandson 9/11 Disposition Plan: home when labs better   Consultants:  GI  Renal  VVS  Procedures: diatek  HPI/Subjective: Anxious to go home  Objective: Filed Vitals:   12/20/13 0525  BP:   Pulse:   Temp: 99.7 F (37.6 C)  Resp:     Intake/Output Summary (Last 24 hours) at 12/20/13 1011 Last data filed at 12/19/13 1720  Gross per 24 hour  Intake    363 ml  Output   2458 ml  Net  -2095 ml   Filed Weights   12/19/13 1330 12/19/13 1720 12/20/13 0322  Weight: 77.3 kg (170 lb 6.7 oz) 74.9 kg (165 lb 2 oz) 75.1 kg (165 lb 9.1 oz)    Exam:   General:  Flat affect.  Eating breakfast  Cardiovascular: rrr without MGR  Respiratory: clear without WRR  Abdomen: +Bs, soft Musculoskeletal: edema much improved. Petechiae   Data Reviewed: Basic Metabolic Panel:  Recent Labs Lab 12/16/13 0359 12/17/13 0500 12/18/13 0604 12/19/13 0500 12/20/13 0620  NA 133* 134* 136* 137 138  K 4.1 3.7 3.9 3.8 4.2  CL 94* 94* 95* 97 99  CO2 GLUCOSE 109* 82 103* 112* 140*  BUN 105* 109* 77* 53* 28*  CREATININE 4.40* 4.49* 3.87* 3.05* 2.58*  CALCIUM 6.7* 7.0* 7.3* 7.4* 7.9*  PHOS 4.7* 4.9* 4.4 3.2 2.4   Liver Function Tests:  Recent Labs Lab 12/16/13 0359 12/17/13 0500 12/18/13 0604 12/19/13 0500 12/20/13 0620  ALBUMIN 1.9* 1.8* 1.9* 1.8* 1.8*   No results found for this basename: LIPASE, AMYLASE,  in the last 168 hours  Recent Labs Lab 12/15/13 0513  AMMONIA 23   CBC:  Recent Labs Lab 12/15/13 0513 12/16/13 0359 12/17/13 0500 12/18/13 0604 12/19/13 1151 12/20/13 0620  WBC 1.1* 1.0* 0.7* 0.6* 0.6* 0.7*  NEUTROABS 0.7* 0.7* 0.4* 0.3*  --  0.3*  HGB 7.0* 7.9* 7.2* 10.3* 10.5* 10.3*  HCT 21.0* 22.9* 22.0* 29.9* 31.9* 31.2*  MCV 83.3 81.8 84.6 82.6 85.5 85.5  PLT 38* 35* 32* 24* 35* 43*   Cardiac Enzymes: No results found for this basename: CKTOTAL, CKMB, CKMBINDEX, TROPONINI,  in the last 168 hours BNP (last 3 results) No results found for this basename: PROBNP,  in the last 8760 hours  CBG:  Recent Labs Lab 12/19/13 0648 12/19/13 1110 12/19/13 1754 12/19/13 2144 12/20/13 0658  GLUCAP 105* 83 163* 140* 151*    No results found for this or any previous visit (from the past 240 hour(s)).   Studies: No results found.  EKG: NSR  Scheduled Meds: . sodium chloride   Intravenous Once  . albuterol  2.5 mg Nebulization QPM  . atenolol  25 mg Oral Daily  . budesonide-formoterol  1 puff Inhalation Daily  . darbepoetin (ARANESP) injection - NON-DIALYSIS  100 mcg Subcutaneous Q Mon-1800  . doxercalciferol  2 mcg  Intravenous Q M,W,F-HD  . feeding supplement (NEPRO CARB STEADY)  237 mL Oral TID BM  . insulin aspart  0-9 Units Subcutaneous TID WC  . insulin aspart  5 Units Subcutaneous TID WC  . multivitamin  1 tablet Oral QHS  . [START ON 12/21/2013] predniSONE  10 mg Oral Q breakfast  . sodium chloride  3 mL Intravenous Q12H  . tiotropium  18 mcg Inhalation Daily   Continuous Infusions: . sodium chloride 10 mL/hr (12/17/13 1015)   Antibiotics Given (last 72 hours)   Date/Time Action Medication Dose Rate   12/17/13 1721 Given   ceFAZolin (ANCEF) IVPB 2 g/50 mL premix 2 g 100 mL/hr     Time spent: 15 min  Charles Franchini, DO  [facility] Pager [phone]. If 7PM-7AM, please contact night-coverage at [email or web address], password [facility] 12/20/2013, 10:11 AM  LOS: 10 days

## 2013-12-20 NOTE — Progress Notes (Signed)
PIV came out right at shift change by accident per pt. Pt only has left arm available with poor access. Dialysis pt, neutropenic, pt is non-telemetry and is getting no IV meds. Order from T.Claiborne Billings with [facility] to leave PIV out.Will continue to assess.

## 2013-12-20 NOTE — Progress Notes (Signed)
Subjective:  S/P  third HD yesterday tolerated well- - counts are still low- looks better today- wants to go home Objective Vital signs in last 24 hours: Filed Vitals:   12/20/13 0048 12/20/13 0304 12/20/13 0322 12/20/13 0525  BP:  110/49    Pulse:  90    Temp: 99.1 F (37.3 C)   99.7 F (37.6 C)  TempSrc:  Oral  Oral  Resp:  18    Height:      Weight:   75.1 kg (165 lb 9.1 oz)   SpO2:  95%     Weight change: -2.5 kg (-5 lb 8.2 oz)  Intake/Output Summary (Last 24 hours) at 12/20/13 0817 Last data filed at 12/19/13 1720  Gross per 24 hour  Intake    363 ml  Output   2458 ml  Net  -2095 ml    Assessment/ Plan: Pt is a 73 y.o. 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 Renal- Biopsy in past-  showing significant disease process and lupus nephritis- has not been able to tolerate treatment for such and now with advanced CKD with GFR in the single digits and  symptomatic.  Plan is now to call this ESRD and make plans for dialysis.  S/P PC and first Wednesday HD 9/9, second Thursday HD 9/10- third Friday 9/11.  To get BVT when platelet count recovers, not sure this will happen this hospitalization ? He has an OP spot starting Monday at [facility]- main issue keeping him here now is leukopenia.  If he goes has spot for Monday- if he doesn't will do here on Monday.  Prednisone is being taken down- took down to 20 mg on Wednesday- will decrease to 10 starting tomorrow- eventually to wean off 2. Pancytopenia- felt due to cellcept.  It has been discontinued. Counts now trending up, this is the first day.  Hosp has curbsided Hematology.  Gave transfusion PRBC on 9/9- has stayed up.  Regarding WBC and platelets- not sure what protocol is regarding discharge from hospital with those numbers.  3. Fall- new L1 compression fracture  4.  Secondary hyperparathyroidism- calcium /phos WNL- no binders needed PTH 400- have started vitamin D 5. HTN/volume- seems OK- no BP meds- is volume overloaded- third spacing- has improved with HD 6. Malnutrition- needs nepro and vitamin 7. Dispo- has OP spot for Monday if discharge is possible before then- per primary team  Andrea Ferrer A    Labs: Basic Metabolic Panel:  Recent Labs Lab 12/17/13 0500 12/18/13 0604 12/19/13 0500  NA 134* 136* 137  K 3.7 3.9 3.8  CL 94* 95* 97  CO2 GLUCOSE 82 103* 112*  BUN 109* 77* 53*  CREATININE 4.49* 3.87* 3.05*  CALCIUM 7.0* 7.3* 7.4*  PHOS 4.9* 4.4 3.2   Liver Function Tests:  Recent Labs Lab 12/17/13 0500 12/18/13 0604 12/19/13 0500  ALBUMIN 1.8* 1.9* 1.8*   No results found for this basename: LIPASE, AMYLASE,  in the last 168 hours  Recent Labs Lab 12/15/13 0513  AMMONIA 23   CBC:  Recent Labs Lab 12/16/13 0359 12/17/13 0500 12/18/13 0604 12/19/13 1151 12/20/13 0620  WBC 1.0* 0.7* 0.6* 0.6* 0.7*  NEUTROABS 0.7* 0.4* 0.3*  --  0.3*  HGB 7.9* 7.2* 10.3* 10.5* 10.3*  HCT 22.9* 22.0* 29.9* 31.9* 31.2*  MCV 81.8 84.6 82.6 85.5  85.5  PLT 35* 32* 24* 35* 43*   Cardiac Enzymes: No results found for this basename: CKTOTAL, CKMB, CKMBINDEX, TROPONINI,  in the last 168 hours CBG:  Recent Labs Lab 12/19/13 0648 12/19/13 1110 12/19/13 1754 12/19/13 2144 12/20/13 0658  GLUCAP 105* 83 163* 140* 151*    Iron Studies:  No results found for this basename: IRON, TIBC, TRANSFERRIN, FERRITIN,  in the last 72 hours Studies/Results: No results found. Medications: Infusions: . sodium chloride 10 mL/hr (12/17/13 1015)    Scheduled Medications: . sodium chloride   Intravenous Once  . albuterol  2.5 mg Nebulization QPM  . atenolol  25 mg Oral Daily  . budesonide-formoterol  1 puff Inhalation Daily  . darbepoetin (ARANESP) injection - NON-DIALYSIS  100 mcg Subcutaneous Q Mon-1800  . doxercalciferol  2  mcg Intravenous Q M,W,F-HD  . feeding supplement (NEPRO CARB STEADY)  237 mL Oral TID BM  . insulin aspart  0-9 Units Subcutaneous TID WC  . insulin aspart  5 Units Subcutaneous TID WC  . multivitamin  1 tablet Oral QHS  . predniSONE  20 mg Oral Q breakfast  . sodium chloride  3 mL Intravenous Q12H  . tiotropium  18 mcg Inhalation Daily    have reviewed scheduled and prn medications.  Physical Exam: General: looks better Heart: RRR Lungs: mostly clear Abdomen: soft, non tender Extremities: pitting edema Dialysis Access: new PC    12/20/2013,8:17 AM  LOS: 10 days

## 2013-12-21 LAB — URINALYSIS, ROUTINE W REFLEX MICROSCOPIC
BILIRUBIN URINE: NEGATIVE
Glucose, UA: 100 mg/dL — AB
Ketones, ur: 15 mg/dL — AB
Leukocytes, UA: NEGATIVE
Nitrite: NEGATIVE
Protein, ur: >300 mg/dL — AB
SPECIFIC GRAVITY, URINE: 1.022 (ref 1.005–1.030)
UROBILINOGEN UA: 0.2 mg/dL (ref 0.0–1.0)
pH: 6 (ref 5.0–8.0)

## 2013-12-21 LAB — CBC WITH DIFFERENTIAL/PLATELET
BASOS ABS: 0 10*3/uL (ref 0.0–0.1)
Basophils Relative: 1 % (ref 0–1)
EOS PCT: 2 % (ref 0–5)
Eosinophils Absolute: 0 10*3/uL (ref 0.0–0.7)
HCT: 30.5 % — ABNORMAL LOW (ref 39.0–52.0)
Hemoglobin: 10 g/dL — ABNORMAL LOW (ref 13.0–17.0)
LYMPHS ABS: 0.3 10*3/uL — AB (ref 0.7–4.0)
Lymphocytes Relative: 33 % (ref 12–46)
MCH: 28 pg (ref 26.0–34.0)
MCHC: 32.8 g/dL (ref 30.0–36.0)
MCV: 85.4 fL (ref 78.0–100.0)
MONO ABS: 0.2 10*3/uL (ref 0.1–1.0)
Monocytes Relative: 15 % — ABNORMAL HIGH (ref 3–12)
Neutro Abs: 0.5 10*3/uL — ABNORMAL LOW (ref 1.7–7.7)
Neutrophils Relative %: 49 % (ref 43–77)
PLATELETS: 54 10*3/uL — AB (ref 150–400)
RBC: 3.57 MIL/uL — ABNORMAL LOW (ref 4.22–5.81)
RDW: 14.9 % (ref 11.5–15.5)
WBC: 1 10*3/uL — CL (ref 4.0–10.5)

## 2013-12-21 LAB — RENAL FUNCTION PANEL
ALBUMIN: 1.8 g/dL — AB (ref 3.5–5.2)
Anion gap: 14 (ref 5–15)
BUN: 38 mg/dL — ABNORMAL HIGH (ref 6–23)
CALCIUM: 8 mg/dL — AB (ref 8.4–10.5)
CO2: 25 mEq/L (ref 19–32)
Chloride: 97 mEq/L (ref 96–112)
Creatinine, Ser: 3.38 mg/dL — ABNORMAL HIGH (ref 0.50–1.35)
GFR calc Af Amer: 19 mL/min — ABNORMAL LOW (ref >90)
GFR calc non Af Amer: 17 mL/min — ABNORMAL LOW (ref >90)
Glucose, Bld: 180 mg/dL — ABNORMAL HIGH (ref 70–99)
PHOSPHORUS: 3.3 mg/dL (ref 2.3–4.6)
Potassium: 4 mEq/L (ref 3.7–5.3)
Sodium: 136 mEq/L — ABNORMAL LOW (ref 137–147)

## 2013-12-21 LAB — GLUCOSE, CAPILLARY
GLUCOSE-CAPILLARY: 167 mg/dL — AB (ref 70–99)
Glucose-Capillary: 177 mg/dL — ABNORMAL HIGH (ref 70–99)

## 2013-12-21 LAB — URINE MICROSCOPIC-ADD ON

## 2013-12-21 MED ORDER — RENA-VITE PO TABS: 1.0000 | ORAL_TABLET | Freq: Every day | ORAL | Status: DC

## 2013-12-21 MED ORDER — NEPRO/CARBSTEADY PO LIQD: 237.0000 mL | Freq: Three times a day (TID) | ORAL | Status: DC

## 2013-12-21 MED ORDER — DOXERCALCIFEROL 4 MCG/2ML IV SOLN: 2.0000 ug | INTRAVENOUS | Status: DC

## 2013-12-21 MED ORDER — DARBEPOETIN ALFA-POLYSORBATE 100 MCG/0.5ML IJ SOLN: 100.0000 ug | INTRAMUSCULAR | Status: DC

## 2013-12-21 MED ORDER — PREDNISONE 10 MG PO TABS
10.0000 mg | ORAL_TABLET | Freq: Every day | ORAL | Status: DC
Start: 2013-12-21 — End: 2015-05-07

## 2013-12-21 NOTE — Progress Notes (Signed)
Pt D/C home.  Alert and oriented x4.  Education given to grandson and pt on diet, activity, meds, and follow-up care and instructions as well as home health set-up and dialysis tomorrow.  Pt and grandson verbalized understanding.  Lucila Maine stated he would be able to provide 24 hour care for pt.

## 2013-12-21 NOTE — Progress Notes (Signed)
Subjective:  No new complaints- counts are trending up- possible discharge today Objective Vital signs in last 24 hours: Filed Vitals:   12/20/13 1426 12/20/13 2032 12/20/13 2219 12/21/13 0445  BP: 110/41 129/62  118/68  Pulse: 78 80  92  Temp: 100.3 F (37.9 C) 100 F (37.8 C)  99.5 F (37.5 C)  TempSrc: Oral Oral  Oral  Resp: Height:      Weight:    75.4 kg (166 lb 3.6 oz)  SpO2: 98% 100% 97% 96%   Weight change: -1.9 kg (-4 lb 3 oz)  Intake/Output Summary (Last 24 hours) at 12/21/13 0858 Last data filed at 12/21/13 0500  Gross per 24 hour  Intake    600 ml  Output    700 ml  Net   -100 ml    Assessment/ Plan: Pt is a 73 y.o. 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 Renal- Biopsy in past-  showing significant disease process and lupus nephritis- has not been able to tolerate treatment for such and now with advanced CKD with GFR in the single digits and  symptomatic.  Plan is now to call this ESRD and make plans for dialysis.  S/P PC and first Wednesday HD 9/9, second Thursday HD 9/10- third Friday 9/11.  To get BVT when platelet count recovers, not sure this will happen this hospitalization ? He has an OP spot starting Monday at [facility]- main issue keeping him here now is leukopenia.  Counts are trending up so primary team making plans for possible discharge today. I will write orders for HD here tomorrow if stays but if goes has spot at [facility] tomorrow- if he has not been told anything would have him be there at 11 AM.  Platelets are trending up- should be adequate in the next 7-10 days or so to get AVF/AVG 2. Pancytopenia- felt due to cellcept.  It has been discontinued. Counts now trending up.  Hosp has curbsided Hematology.  Gave transfusion PRBC on 9/9- has stayed up.   3.  Fall- new L1 compression fracture  4. Secondary hyperparathyroidism- calcium /phos WNL- no binders needed PTH 400- have started vitamin D 5. HTN/volume- seems OK- no BP meds- is volume overloaded- third spacing- has improved with HD 6. Malnutrition- needs nepro and vitamin 7. Dispo- has OP spot for Monday [facility]- continue to wean prednisone to off as OP  Oden Lindaman A    Labs: Basic Metabolic Panel:  Recent Labs Lab 12/19/13 0500 12/20/13 0620 12/21/13 0655  NA 137 138 136*  K 3.8 4.2 4.0  CL 97 99 97  CO2 GLUCOSE 112* 140* 180*  BUN 53* 28* 38*  CREATININE 3.05* 2.58* 3.38*  CALCIUM 7.4* 7.9* 8.0*  PHOS 3.2 2.4 3.3   Liver Function Tests:  Recent Labs Lab 12/19/13 0500 12/20/13 0620 12/21/13 0655  ALBUMIN 1.8* 1.8* 1.8*   No results found for this basename: LIPASE, AMYLASE,  in the last 168 hours  Recent Labs Lab 12/15/13 0513  AMMONIA 23   CBC:  Recent Labs Lab 12/17/13 0500 12/18/13 0604 12/19/13 1151 12/20/13 0620 12/21/13 0655  WBC 0.7* 0.6* 0.6* 0.7* 1.0*  NEUTROABS 0.4* 0.3*  --  0.3* 0.5*  HGB 7.2* 10.3* 10.5* 10.3* 10.0*  HCT 22.0* 29.9* 31.9* 31.2* 30.5*  MCV 84.6 82.6 85.5 85.5 85.4  PLT 32* 24* 35* 43* 54*   Cardiac Enzymes: No results found for this basename: CKTOTAL, CKMB, CKMBINDEX, TROPONINI,  in the last 168 hours CBG:  Recent Labs Lab 12/20/13 0658 12/20/13 1105 12/20/13 1607 12/20/13 2137 12/21/13 0609  GLUCAP 151* 86 146* 105* 167*    Iron Studies:  No results found for this basename: IRON, TIBC, TRANSFERRIN, FERRITIN,  in the last 72 hours Studies/Results: Dg Chest 2 View  12/20/2013   CLINICAL DATA:  Fever with cough  EXAM: CHEST  2 VIEW  COMPARISON:  12/17/2013  FINDINGS: Stable dual-lumen central line. Heart size and vascular pattern normal. Hyperinflation. No consolidation or effusion.  IMPRESSION: No active cardiopulmonary disease.   Electronically Signed   By: Esperanza Heir M.D.   On:  12/20/2013 11:54   Medications: Infusions: . sodium chloride 10 mL/hr (12/17/13 1015)    Scheduled Medications: . sodium chloride   Intravenous Once  . albuterol  2.5 mg Nebulization QPM  . atenolol  25 mg Oral Daily  . budesonide-formoterol  1 puff Inhalation Daily  . darbepoetin (ARANESP) injection - NON-DIALYSIS  100 mcg Subcutaneous Q Mon-1800  . doxercalciferol  2 mcg Intravenous Q M,W,F-HD  . feeding supplement (NEPRO CARB STEADY)  237 mL Oral TID BM  . insulin aspart  0-9 Units Subcutaneous TID WC  . insulin aspart  5 Units Subcutaneous TID WC  . multivitamin  1 tablet Oral QHS  . predniSONE  10 mg Oral Q breakfast  . sodium chloride  3 mL Intravenous Q12H  . tiotropium  18 mcg Inhalation Daily    have reviewed scheduled and prn medications.  Physical Exam: General: looks better Heart: RRR Lungs: mostly clear Abdomen: soft, non tender Extremities: pitting edema Dialysis Access: new PC    12/21/2013,8:58 AM  LOS: 11 days

## 2013-12-21 NOTE — Discharge Summary (Signed)
Physician Discharge Summary  Charles [name]:[id number] DOB: March 10, 1941 DOA: 12/10/2013  PCP: Aida Puffer, MD  Admit date: 12/10/2013 Discharge date: 12/21/2013  Time spent: 35 minutes  Recommendations for Outpatient Follow-up:  Home health 24 hour care by family No driving HD- M/WF--- [facility] Wednesday Return to ER if fever  Discharge Diagnoses:  Principal Problem:   Anemia of chronic kidney failure Active Problems:   Stage IV lupus nephritis (WHO)   CKD (chronic kidney disease) stage 5, GFR less than 15 ml/min   Occult blood positive stool   Compression fracture of L1 lumbar vertebra   Other pancytopenia   Essential hypertension, benign   DM (diabetes mellitus), type 2 with renal complications   Discharge Condition: improved  Diet recommendation: renal  Filed Weights   12/19/13 1720 12/20/13 0322 12/21/13 0445  Weight: 74.9 kg (165 lb 2 oz) 75.1 kg (165 lb 9.1 oz) 75.4 kg (166 lb 3.6 oz)    History of present illness:  Charles [name] is a 73 y.o. male recently seen at [facility] and diagnosed with lupus nephritis type IV (WHO classification), with new diagnosis of CKD stage 4-5. He was started on Prednisone and celcept and told to follow up with nephrology "with in a week". Unfortunately nearly a month later he hasnt been able to get in to see nephrology although he did successfully see a rheumatologist over at [facility] whom family states noted that his kidney function continued to look bad but was "stable" since his [facility] discharge at that time. [facility] records are unfortunately unavailable here at this time.  He was sent in to the ED today by his PCP after lab work showed his HGB to be low at 7.5, this was confirmed today in ED at 7.4.  Patient also had a fall recently and has been having back pain since then.   Hospital Course:  Anemia secondary to intermittent limited LGIB/cellcept/CKD -  hgb 10.3 after one unit pRBC   Lupus nephritis: cellcept stopped due to  pancytopenia. pred decreased to 20 mg. Diatek placed and had HD yesterday. Permanent access when thrombocytopenia improved.   Pancytopenia: normal WBC 11/27/13 (from [facility]), hgb 9.5, platelet count 97K. Secondary to cellcept. Counts still droppoing. Dr. Lendell Caprice discussed with Dr. Clelia Croft. No role for neupogen without fever/infection. May take some time to recover  Occult blood positive stool - colonoscopy as outpatient. CT abd pelvis without cirrhosis   Compression fracture of L1 - norco PRN pain   Dementia vs uremia v. Prednisone effect. B12, folate ok. NH3, TSH ok. No behavioral problems   HTN: norvasc d/c'd due to borderline blood pressures low   Nephrotic syndrome. Weight on admission 197 lbs, 175 today with diuresis.   FTT: add nepro. Consult dietician. Likely from uremia   DM 2 with renal complications: controlled   Procedures:  Diatek and HD  Consultations:  GI  Renal  VVS  Discharge Exam: Filed Vitals:   12/21/13 0445  BP: 118/68  Pulse: 92  Temp: 99.5 F (37.5 C)  Resp: 19    General: pleasant/cooperative- anxious to go home Cardiovascular: rrr Respiratory: clear  Discharge Instructions You were cared for by a hospitalist during your hospital stay. If you have any questions about your discharge medications or the care you received while you were in the hospital after you are discharged, you can call the unit and asked to speak with the hospitalist on call if the hospitalist that took care of you is not available. Once  you are discharged, your primary care physician will handle any further medical issues. Please note that NO REFILLS for any discharge medications will be authorized once you are discharged, as it is imperative that you return to your primary care physician (or establish a relationship with a primary care physician if you do not have one) for your aftercare needs so that they can reassess your need for medications and monitor your lab  values.  Discharge Instructions   Discharge instructions    Complete by:  As directed   Home health 24 hour care by family No driving HD- M/WF--- [facility] Wednesday Return to ER if fever     Increase activity slowly    Complete by:  As directed           Current Discharge Medication List    START taking these medications   Details  darbepoetin (ARANESP) 100 MCG/0.5ML SOLN injection Inject 0.5 mLs (100 mcg total) into the skin every Monday at 6 PM. Qty: 4.2 mL    doxercalciferol (HECTOROL) 4 MCG/2ML injection Inject 1 mL (2 mcg total) into the vein every Monday, Wednesday, and Friday with hemodialysis. Qty: 2 mL    multivitamin (RENA-VIT) TABS tablet Take 1 tablet by mouth at bedtime. Qty: 30 tablet, Refills: 0    Nutritional Supplements (FEEDING SUPPLEMENT, NEPRO CARB STEADY,) LIQD Take 237 mLs by mouth 3 (three) times daily between meals. Qty: 1 Can, Refills: 12      CONTINUE these medications which have CHANGED   Details  predniSONE (DELTASONE) 10 MG tablet Take 1 tablet (10 mg total) by mouth daily with breakfast. Qty: 30 tablet, Refills: 0      CONTINUE these medications which have NOT CHANGED   Details  albuterol (PROVENTIL HFA;VENTOLIN HFA) 108 (90 BASE) MCG/ACT inhaler Inhale 2 puffs into the lungs 3 (three) times daily.    atenolol (TENORMIN) 25 MG tablet Take 25 mg by mouth daily.    budesonide-formoterol (SYMBICORT) 80-4.5 MCG/ACT inhaler Inhale 1 puff into the lungs daily.    HYDROcodone-acetaminophen (NORCO) 7.5-325 MG per tablet Take 1 tablet by mouth 2 (two) times daily as needed (for pain).    insulin aspart (NOVOLOG FLEXPEN) 100 UNIT/ML FlexPen Inject 5 Units into the skin 3 (three) times daily with meals. As needed for glucose more then 150    MELATONIN PO Take 1 tablet by mouth at bedtime as needed (sleep).    omeprazole (PRILOSEC) 20 MG capsule Take 20 mg by mouth daily.    tiotropium (SPIRIVA) 18 MCG inhalation capsule Place 18 mcg into  inhaler and inhale daily.      STOP taking these medications     amLODipine (NORVASC) 10 MG tablet      mycophenolate (CELLCEPT) 500 MG tablet        No Known Allergies Follow-up Information   Follow up with Willette Cluster, NP On 01/05/2014. (9:30 a.m.-be there 15 minutes early. Bring all medicines and insurance card.)    Specialty:  Nurse Practitioner   Contact information:   [address] [place] [address] [phone]       Follow up with Aida Puffer, MD In 1 week.   Specialty:  Family Medicine   Contact information:   [address] [place] [address] [phone]       Follow up with Cecille Aver, MD.   Specialty:  Nephrology   Contact information:   [address] [place] [address] [phone]  The results of significant diagnostics from this hospitalization (including imaging, microbiology, ancillary and laboratory) are listed below for reference.    Significant Diagnostic Studies: Ct Abdomen Wo Contrast  01/02/14   CLINICAL DATA:  Evaluate for cirrhosis. Multifactorial anemia. Occasional rectal bleeding. Thrombocytopenia. Heavy alcohol use.  EXAM: CT ABDOMEN WITHOUT CONTRAST  TECHNIQUE: Multidetector CT imaging of the abdomen was performed following the standard protocol without IV contrast.  COMPARISON:  05/28/2005  FINDINGS: Lower Chest: Mild scarring at the right lung base. Mild cylindrical bronchiectasis at the left lung base which is most likely post infectious. Mild cardiomegaly. Anemia, as evidenced by decreased density in the intravascular space. Tiny hiatal hernia. No pericardial or pleural effusion.  Abdomen: No evidence of cirrhosis. Left hepatic lobe partially exophytic cyst measures 2.9 cm.  Normal spleen, stomach. There is upper abdominal edema which is likely centered about the pancreas and lesser sac. Example images 23-27 of series 2.  Normal gallbladder, without biliary ductal dilatation.  Normal adrenal glands. Interpolar  right renal cyst anteriorly. Too small to characterize lesions in both kidneys, likely cysts. No hydronephrosis.  Aortic and branch vessel atherosclerosis. No retroperitoneal or retrocrural adenopathy. Mildly prominent thoracic duct incidentally noted.  Normal abdominal bowel loops, without pneumatosis or free intraperitoneal air. Trace fluid or thickening in the right pericolic gutter including on image 24. Nonspecific perirenal interstitial thickening. This is subtly increased in density, including on images 29 and 32 of series 2.  Bones/Musculoskeletal: Superior endplate compression deformity at L1. This is mild and favored to be acute to subacute. Sagittal image 67. This is detailed on yesterday's plain films.  IMPRESSION: 1. No evidence of cirrhosis. 2. L1 compression deformity, likely acute/subacute. 3. Upper abdominal edema, favored to be within the peripancreatic space. Correlate with clinical and laboratory signs of pancreatitis. Less likely considerations include duodenitis. 4. Trace fluid within or thickening of the right pericolic gutter. Perirenal interstitial thickening is nonspecific and may relate to renal insufficiency. This does appear somewhat hyper attenuating. If there is a history of signet trauma that might cause hemorrhage arising from the pelvis (fell unlikely), dedicated pelvic imaging may be informative.   Electronically Signed   By: Jeronimo Greaves M.D.   On: 2014-01-02 17:06   Dg Chest 2 View  12/20/2013   CLINICAL DATA:  Fever with cough  EXAM: CHEST  2 VIEW  COMPARISON:  12/17/2013  FINDINGS: Stable dual-lumen central line. Heart size and vascular pattern normal. Hyperinflation. No consolidation or effusion.  IMPRESSION: No active cardiopulmonary disease.   Electronically Signed   By: Esperanza Heir M.D.   On: 12/20/2013 11:54   Dg Lumbar Spine Complete  12/10/2013   CLINICAL DATA:  Fall.  EXAM: LUMBAR SPINE - COMPLETE 4+ VIEW  COMPARISON:  MRI lumbar spine 06/18/2005.  FINDINGS:  Paraspinal soft tissues are normal. Degenerative changes thoracolumbar spine. Diffuse osteopenia. No evidence of malalignment. Minimal L1 compression. This is new. No retropulsed fragments. Aortoiliac atherosclerotic vascular disease.  IMPRESSION: 1. Minimal L1 compression. No retropulsed fragments. This is a new finding from prior MRI of 06/18/2005. 2. Diffuse osteopenia and degenerative change. Normal bony alignment. 3. Aortoiliac atherosclerotic vascular disease.   Electronically Signed   By: Maisie Fus  Register   On: 12/10/2013 19:36   Dg Chest Port 1 View  12/17/2013   CLINICAL DATA:  Post Diatek catheter insertion  EXAM: PORTABLE CHEST - 1 VIEW  COMPARISON:  Portable exam 1235 hr compared to 12/26/2007  FINDINGS: RIGHT jugular dual-lumen central venous catheter with tip  projecting over mid SVC.  Normal heart size, mediastinal contours, and pulmonary vascularity.  Lungs appear hyperaerated but clear.  No infiltrate, pleural effusion, pneumothorax, or acute osseous findings.  IMPRESSION: No pneumothorax following catheter placement.   Electronically Signed   By: Ulyses Southward M.D.   On: 12/17/2013 13:01    Microbiology: No results found for this or any previous visit (from the past 240 hour(s)).   Labs: Basic Metabolic Panel:  Recent Labs Lab 12/17/13 0500 12/18/13 0604 12/19/13 0500 12/20/13 0620 12/21/13 0655  NA 134* 136* 137 138 136*  K 3.7 3.9 3.8 4.2 4.0  CL 94* 95* 97 99 97  CO2 GLUCOSE 82 103* 112* 140* 180*  BUN 109* 77* 53* 28* 38*  CREATININE 4.49* 3.87* 3.05* 2.58* 3.38*  CALCIUM 7.0* 7.3* 7.4* 7.9* 8.0*  PHOS 4.9* 4.4 3.2 2.4 3.3   Liver Function Tests:  Recent Labs Lab 12/17/13 0500 12/18/13 0604 12/19/13 0500 12/20/13 0620 12/21/13 0655  ALBUMIN 1.8* 1.9* 1.8* 1.8* 1.8*   No results found for this basename: LIPASE, AMYLASE,  in the last 168 hours  Recent Labs Lab 12/15/13 0513  AMMONIA 23   CBC:  Recent Labs Lab 12/16/13 0359  12/17/13 0500 12/18/13 0604 12/19/13 1151 12/20/13 0620 12/21/13 0655  WBC 1.0* 0.7* 0.6* 0.6* 0.7* 1.0*  NEUTROABS 0.7* 0.4* 0.3*  --  0.3* 0.5*  HGB 7.9* 7.2* 10.3* 10.5* 10.3* 10.0*  HCT 22.9* 22.0* 29.9* 31.9* 31.2* 30.5*  MCV 81.8 84.6 82.6 85.5 85.5 85.4  PLT 35* 32* 24* 35* 43* 54*   Cardiac Enzymes: No results found for this basename: CKTOTAL, CKMB, CKMBINDEX, TROPONINI,  in the last 168 hours BNP: BNP (last 3 results) No results found for this basename: PROBNP,  in the last 8760 hours CBG:  Recent Labs Lab 12/20/13 0658 12/20/13 1105 12/20/13 1607 12/20/13 2137 12/21/13 0609  GLUCAP 151* 86 146* 105* 167*       Signed:  Wilma Wuthrich  [facility] 12/21/2013, 9:18 AM

## 2013-12-26 LAB — CULTURE, BLOOD (ROUTINE X 2)
Culture: NO GROWTH
Culture: NO GROWTH

## 2014-01-05 ENCOUNTER — Telehealth: Payer: Self-pay | Admitting: *Deleted

## 2014-01-05 ENCOUNTER — Ambulatory Visit: Payer: Medicare Other | Admitting: Nurse Practitioner

## 2014-01-05 ENCOUNTER — Encounter: Payer: Self-pay | Admitting: *Deleted

## 2014-01-05 NOTE — Progress Notes (Unsigned)
Patient ID: Charles [name], male   DOB: June 19, 1940, 73 y.o.   MRN: [id number] The patient's chart has been reviewed by Gunnar Fusi  and the recommendations are noted below.  Follow-up necessary. Contact patient and schedule visit in 3 weeks.   Outcome of communication with the patient: LM for patient to call me back so we can schedule him an office visit to follow up, post hospital stay. Per Willette Cluster NP

## 2014-01-05 NOTE — Telephone Encounter (Signed)
I called and left a message on the home number and the cell number. I advised he was scheduled for today to see Willette Cluster NP-C and he did not come.  We wanted to reschedule him.

## 2014-01-19 NOTE — Telephone Encounter (Signed)
I called and spoke to PembrokeBrandon, the patient's grandson.  He let me know that the patient took off from his home and they couldn't locate him.They had to do a "silver alert" on him. They did find him so they have arranged for him to be in a nursing home, but not [place] in [place].  [place]

## 2014-12-09 IMAGING — CR DG LUMBAR SPINE COMPLETE 4+V
6 series · 6 of 6 positions shown · non-contrast
Comparison: MRI lumbar spine 06/18/2005.

CLINICAL DATA: Fall.

EXAM:
LUMBAR SPINE - COMPLETE 4+ VIEW

[t l-spine a.p.]
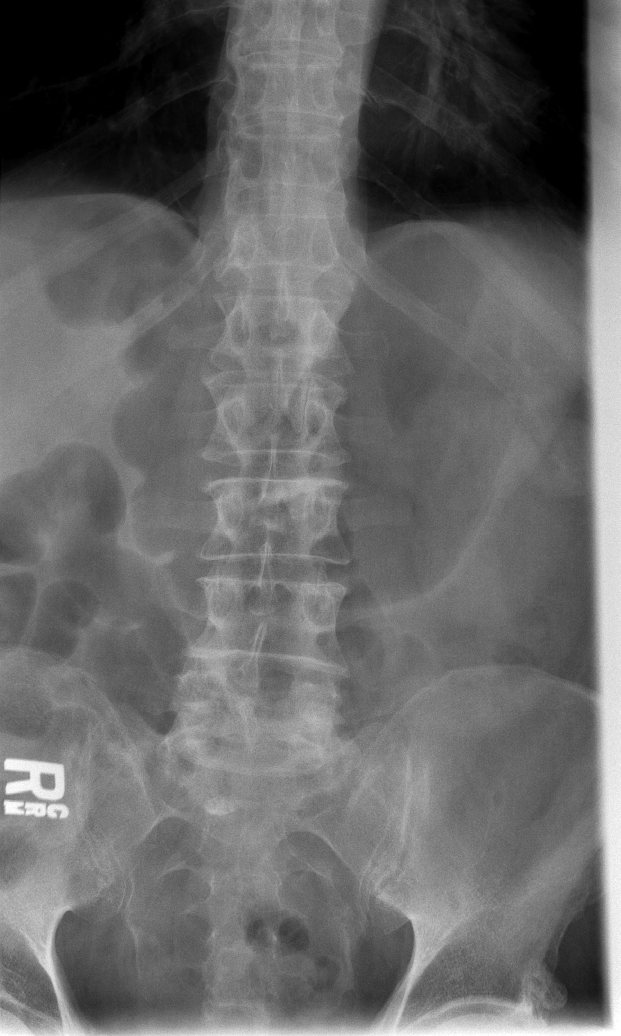

[t l-spine oblique exposure (1 of 2)]
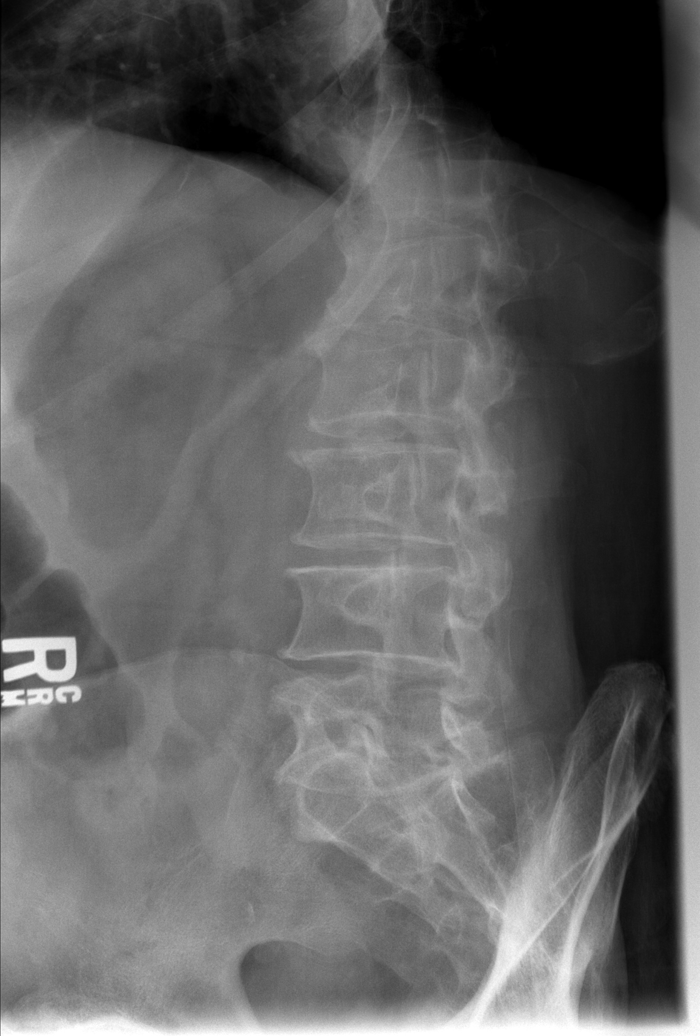

[t l-spine oblique exposure (2 of 2)]
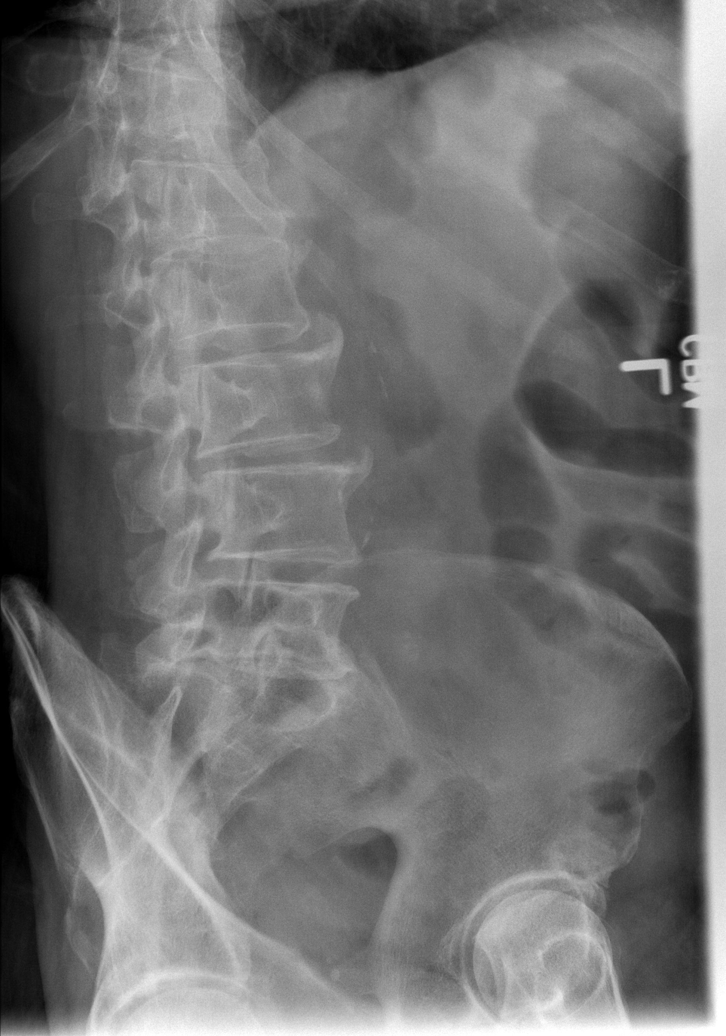

[t l-spine lat (1 of 2)]
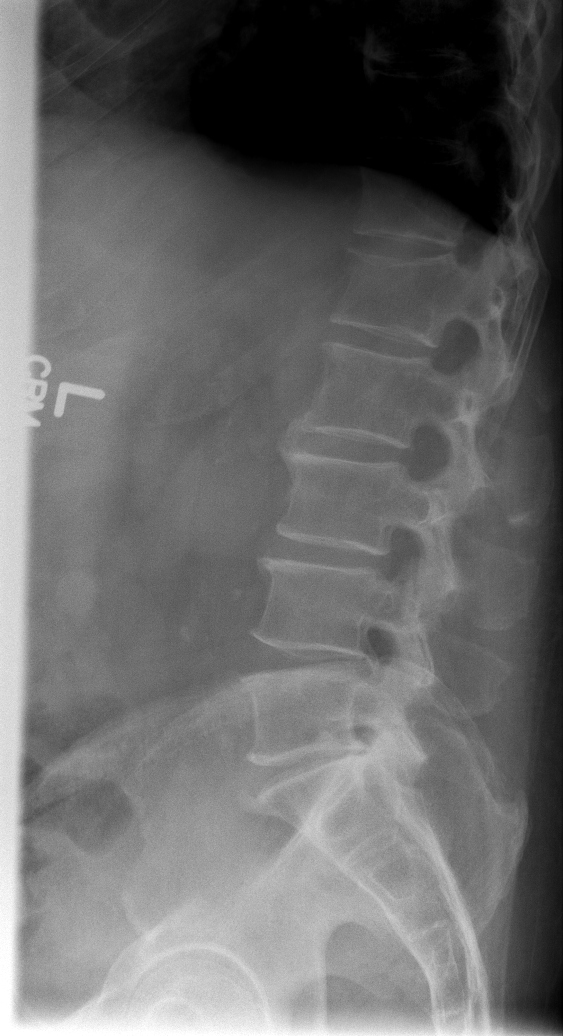

[t l-spine lat (2 of 2)]
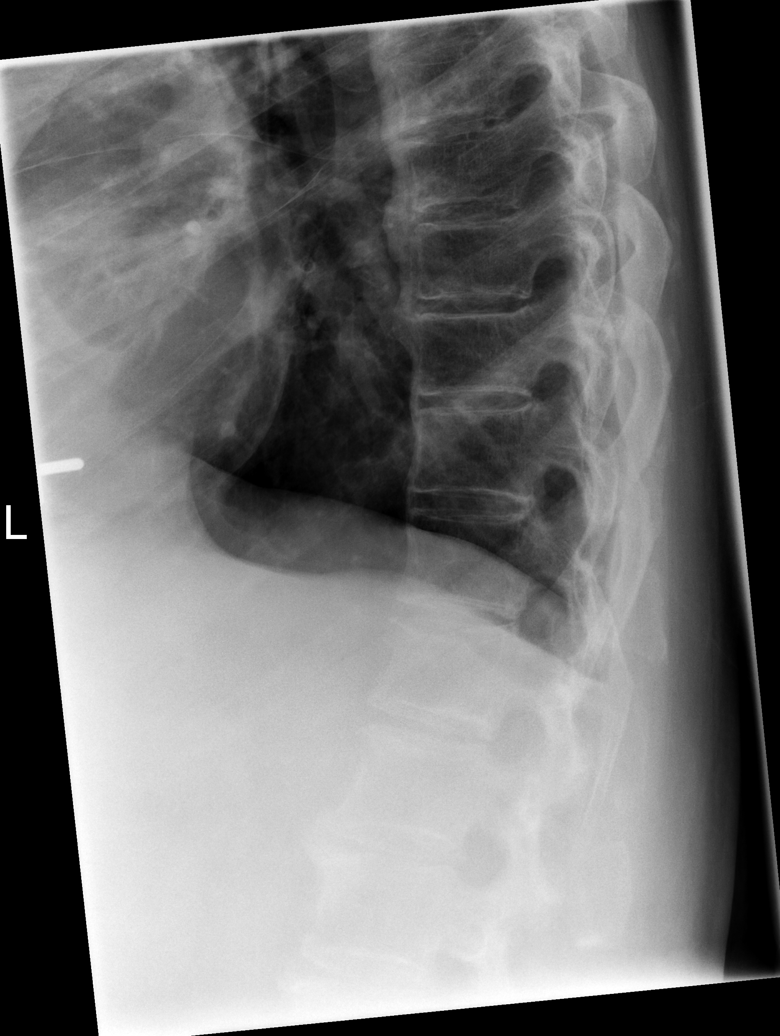

[t l-spine l5-s1 spot]
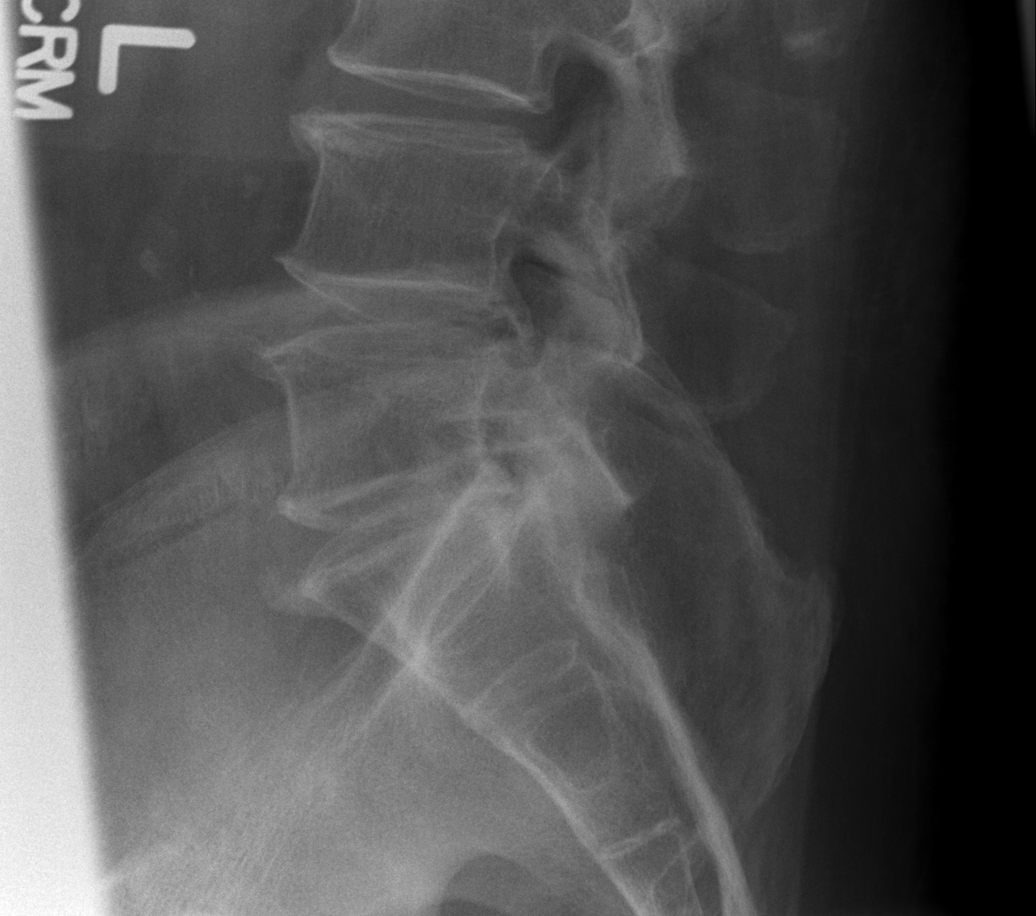

[6 of 6 positions shown; findings below may reference images not displayed]

FINDINGS: Paraspinal soft tissues are normal. Degenerative changes
thoracolumbar spine. Diffuse osteopenia. No evidence of
malalignment. Minimal L1 compression. This is new. No retropulsed
fragments. Aortoiliac atherosclerotic vascular disease.
IMPRESSION: 1. Minimal L1 compression. No retropulsed fragments. This is a new
finding from prior MRI of 06/18/2005.
[DATE]. Diffuse osteopenia and degenerative change. Normal bony
alignment.
3. Aortoiliac atherosclerotic vascular disease.

## 2015-03-30 ENCOUNTER — Non-Acute Institutional Stay (SKILLED_NURSING_FACILITY): Payer: Medicare Other | Admitting: Internal Medicine

## 2015-03-30 DIAGNOSIS — M3214 Glomerular disease in systemic lupus erythematosus: Secondary | ICD-10-CM

## 2015-03-30 DIAGNOSIS — F0281 Dementia in other diseases classified elsewhere with behavioral disturbance: Secondary | ICD-10-CM | POA: Diagnosis not present

## 2015-03-30 DIAGNOSIS — G308 Other Alzheimer's disease: Secondary | ICD-10-CM | POA: Diagnosis not present

## 2015-03-30 DIAGNOSIS — G20 Parkinson's disease: Secondary | ICD-10-CM

## 2015-03-30 DIAGNOSIS — J449 Chronic obstructive pulmonary disease, unspecified: Secondary | ICD-10-CM

## 2015-03-30 DIAGNOSIS — M329 Systemic lupus erythematosus, unspecified: Secondary | ICD-10-CM

## 2015-03-30 DIAGNOSIS — G309 Alzheimer's disease, unspecified: Secondary | ICD-10-CM

## 2015-04-05 NOTE — Progress Notes (Addendum)
[name] ID: Charles [name], male   DOB: Nov 28, 1940, 74 y.o.   MRN: [id number]                HISTORY & PHYSICAL  DATE:  03/30/2015      [facility]               LEVEL OF CARE:   SNF   CHIEF COMPLAINT:  Admission to Charles facility from home, review of medical records.    HISTORY OF PRESENT ILLNESS:  This is a 74 year-old man who has a history of systemic lupus with lupus nephritis who at one point was on dialysis up to a year ago.  Charles [name] apparently stopped dialysis a year ago at his own volition.  He has apparently remained reasonably healthy.    He does not have recent [facility] in [facility].  His daughter, who is here today, says that he has been at her home for Charles last 2-1/2 months.   Prior to this, [place] was at [place] and prior to that was in hospital in [place] with pneumonia several months ago, and prior to that was at a nursing home in [place].  He did have Hospice input while at his daughter's home most recently.  They tell me that they could no longer look after him at home as he was occasionally up wandering at night, reminiscing about things in Charles past, etc., etc., etc.  His FL2 makes reference to dementia, Alzheimer's disease.  I am uncertain about his hospice status in Charles facility.       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    Systemic lupus.     Essential hypertension.     Anemia of chronic renal failure.     Hyperlipidemia.    Alzheimer's dementia.     COPD and/or asthma.    History of occult blood positive stool.    Compression fracture of L1.    Pancytopenia.     Type 2 diabetes with renal complications.    CURRENT MEDICATIONS:  Medication list is reviewed.            Atenolol 25 q.d.     Melatonin 3 mg at bedtime.    Omeprazole 20 q.d.     Ropinirole 1 mg t.i.d.     Simvastatin 10 q.d.    Tylenol 650 q.4 p.r.n.     MiraLAX 17 g q.d.  p.r.n.      Zofran 4 mg t.i.d. p.r.n.     Temazepam 7.5 q.h.s. p.r.n.      Haldol 5 mg q.4 p.r.n. agitation.     *Noted that this is quite a bit different from Charles FL2 dated 03/09/2015, although Charles nurse tells me that his medication was adjusted by Hospice to reflect more accurately what he was getting at home.    SOCIAL HISTORY:                   HOUSING:  Charles [name] was living at home with his daughter.   FUNCTIONAL STATUS:  Apparently Hospice in-home care.  I am not exactly certain of his functional status.  He did have some falls.  His family tells me he was having trouble getting himself dressed.    REVIEW OF SYSTEMS:       GENERAL:  There had been some weight loss.  Family states that Charles  [name] will never complain of anything.   CHEST/RESPIRATORY:  No shortness of breath.      CARDIAC:  No chest pain.    GI:  No nausea, vomiting, or diarrhea.         [name]:  He states he voids three times a day.     EDEMA/VARICOSITIES:  Extremities:  No edema.     NEUROLOGICAL:  Apparently, deterioration in his gait has been noted over a period of months.     PHYSICAL EXAMINATION:   GENERAL APPEARANCE:  Charles [name] is not in any distress.        HEENT:   MOUTH/THROAT:  Stained tongue, but no lesions are seen.  Apparently, he used to chew tobacco but not recently.    CHEST/RESPIRATORY:  Clear air entry bilaterally.     CARDIOVASCULAR:   CARDIAC:  Heart sounds are normal.  No S3.  JVP is elevated.        GASTROINTESTINAL:   LIVER/SPLEEN/KIDNEY:   No liver, no spleen.  No tenderness.    GENITOURINARY:   BLADDER:  Not distended.  There is no CVA tenderness.    CIRCULATION:   EDEMA/VARICOSITIES:  Extremities:  No edema is noted.    SKIN:   INSPECTION:  He has a functional shunt in his left forearm.     NEUROLOGICAL:  He has equal strength bilaterally.  There is increasing tone, bradykinesia, and a Parkinson's-looking gait.   PSYCHIATRIC:   MENTAL STATUS:  I did not spend a lot of time on  this today.  However, Charles questions that he answered versus Charles family seemed to be quite accurate.     ASSESSMENT/PLAN:               Listed as having Alzheimer's disease.   I also note that there was some reference in [facility] to heavy alcohol use.    Parkinson's disease.   On ropinirole.  His symptom control here is marginal.  I am not certain that he really needs to be taking Haldol p.r.n., although I will leave this currently.    COPD.  There is no evidence of this currently.  A lot of his inhalers were discontinued.     At one point, stage V chronic renal failure.  He has been under Hospice care.  Therefore, there may not have been recent lab work.    History of systemic lupus.   At one point, he was on steroids.  He is not on that currently.    I asked this man if he would take dialysis again if his life depended on it and his response was affirmative.  I think this surprised his daughter and son-in-law who were in Charles room.  Once again, there is going to have to be some clarification about care here.  I will probably end up needing to assess his cognitive abilities in order to know whether he has any role or ability to participate in his own decision-making.  At this point, Hospice's role in Charles facility is not clear.

## 2015-04-21 LAB — CBC AND DIFFERENTIAL
HCT: 35 % — AB (ref 41–53)
HEMOGLOBIN: 10.7 g/dL — AB (ref 13.5–17.5)
WBC: 9.2 10^3/mL

## 2015-04-21 LAB — BASIC METABOLIC PANEL
BUN: 50 mg/dL — AB (ref 4–21)
CREATININE: 3.7 mg/dL — AB (ref <=1.3)
GLUCOSE: 89 mg/dL
POTASSIUM: 5.2 mmol/L (ref 3.4–5.3)
SODIUM: 145 mmol/L (ref 137–147)

## 2015-05-05 ENCOUNTER — Other Ambulatory Visit: Payer: Self-pay

## 2015-05-05 MED ORDER — TEMAZEPAM 7.5 MG PO CAPS: 7.5000 mg | ORAL_CAPSULE | Freq: Every evening | ORAL | Status: DC | PRN

## 2015-05-07 ENCOUNTER — Encounter: Payer: Self-pay | Admitting: Internal Medicine

## 2015-05-07 ENCOUNTER — Non-Acute Institutional Stay (SKILLED_NURSING_FACILITY): Payer: Medicare Other | Admitting: Internal Medicine

## 2015-05-07 DIAGNOSIS — R062 Wheezing: Secondary | ICD-10-CM | POA: Insufficient documentation

## 2015-05-07 DIAGNOSIS — R251 Tremor, unspecified: Secondary | ICD-10-CM | POA: Insufficient documentation

## 2015-05-07 DIAGNOSIS — F0390 Unspecified dementia without behavioral disturbance: Secondary | ICD-10-CM

## 2015-05-07 DIAGNOSIS — I10 Essential (primary) hypertension: Secondary | ICD-10-CM

## 2015-05-07 DIAGNOSIS — G4700 Insomnia, unspecified: Secondary | ICD-10-CM | POA: Insufficient documentation

## 2015-05-07 DIAGNOSIS — N185 Chronic kidney disease, stage 5: Secondary | ICD-10-CM | POA: Diagnosis not present

## 2015-05-07 DIAGNOSIS — E1122 Type 2 diabetes mellitus with diabetic chronic kidney disease: Secondary | ICD-10-CM

## 2015-05-07 MED ORDER — IPRATROPIUM-ALBUTEROL 0.5-2.5 (3) MG/3ML IN SOLN: RESPIRATORY_TRACT | Status: DC

## 2015-05-07 NOTE — Progress Notes (Signed)
[place] Room Number: [place] of Service: SNF (31)     No Known Allergies  Chief Complaint  Patient presents with  . Medical Management of Chronic Issues    c/o diarrhea, refusing medications    HPI:  Patient was admitted to [facility] 03/29/2015. He has end-stage renal disease and was on dialysis, but quit this over a year ago. He is on hospice. He spends a lot of time in bed now. Patient has dementia which has been attributed to Alzheimer's disease.  Patient family recently noticed increase in shaking of his arms. It is not always there and does not seem to interfere with any activities of daily living.  Other complaints today are of insomnia. He gets melatonin 3 mg at bedtime. He also has Restoril to be used as needed but rarely gets this.  Patient has occasional complaints of shortness of breath and wheezing. Staff is requesting an order for scheduled DuoNeb.  Patient is anemic with his last hemoglobin 10.7 g percent on 04/21/2015  On the same date, BUN was 50 and creatinine 3.7. These values are similar to those obtained in September 2015.  Earlier this month the patient complained of diarrhea. He thinks medications have created the problem. He was refusing medications for several days, but is now taking medications. Problem with diarrhea seems resolved.  Medications: Patient's Medications  New Prescriptions   No medications on file  Previous Medications   ALBUTEROL (PROVENTIL HFA;VENTOLIN HFA) 108 (90 BASE) MCG/ACT INHALER    Inhale 2 puffs into the lungs 3 (three) times daily.   ATENOLOL (TENORMIN) 25 MG TABLET    Take 25 mg by mouth daily.   BUDESONIDE-FORMOTEROL (SYMBICORT) 80-4.5 MCG/ACT INHALER    Inhale 1 puff into the lungs daily.   HYDROCODONE-ACETAMINOPHEN (NORCO) 7.5-325 MG PER TABLET    Take 1 tablet by mouth 2 (two) times daily as needed (for pain).   INSULIN ASPART (NOVOLOG FLEXPEN) 100 UNIT/ML FLEXPEN     Inject 5 Units into the skin 3 (three) times daily with meals. As needed for glucose more then 150   LAMOTRIGINE (LAMICTAL) 25 MG TABLET    Take 25 mg by mouth 2 (two) times daily.   MELATONIN PO    Take 3 mg by mouth at bedtime as needed (sleep).    MULTIVITAMIN (RENA-VIT) TABS TABLET    Take 1 tablet by mouth at bedtime.   NUTRITIONAL SUPPLEMENTS (FEEDING SUPPLEMENT, NEPRO CARB STEADY,) LIQD    Take 237 mLs by mouth 3 (three) times daily between meals.   OMEPRAZOLE (PRILOSEC) 20 MG CAPSULE    Take 20 mg by mouth daily.   POLYETHYLENE GLYCOL (MIRALAX / GLYCOLAX) PACKET    Take 17 g by mouth daily.   ROPINIROLE (REQUIP) 1 MG TABLET    Take 1 mg by mouth 3 (three) times daily.   SIMVASTATIN (ZOCOR) 10 MG TABLET    Take 10 mg by mouth at bedtime.   TEMAZEPAM (RESTORIL) 7.5 MG CAPSULE    Take 1 capsule (7.5 mg total) by mouth at bedtime as needed.  Modified Medications   No medications on file  Discontinued Medications   DARBEPOETIN (ARANESP) 100 MCG/0.5ML SOLN INJECTION    Inject 0.5 mLs (100 mcg total) into the skin every Monday at 6 PM.   DOXERCALCIFEROL (HECTOROL) 4 MCG/2ML INJECTION    Inject 1 mL (2 mcg total) into the vein every Monday, Wednesday, and Friday with hemodialysis.  PREDNISONE (DELTASONE) 10 MG TABLET    Take 1 tablet (10 mg total) by mouth daily with breakfast.   TIOTROPIUM (SPIRIVA) 18 MCG INHALATION CAPSULE    Place 18 mcg into inhaler and inhale daily.     Review of Systems  Constitutional: Positive for activity change, appetite change and fatigue. Negative for fever and unexpected weight change.       Losing weight  HENT: Negative for congestion, ear pain, hearing loss, rhinorrhea, sore throat, tinnitus, trouble swallowing and voice change.   Eyes:       Corrective lenses  Respiratory: Negative for cough, choking, chest tightness, shortness of breath and wheezing.   Cardiovascular: Negative for chest pain, palpitations and leg swelling.  Gastrointestinal: Negative  for nausea, abdominal pain, diarrhea, constipation and abdominal distention.  Endocrine: Negative for cold intolerance, heat intolerance, polydipsia, polyphagia and polyuria.  Genitourinary: Negative for dysuria, urgency, frequency and testicular pain.       End-stage renal disease. Now off dialysis per patient choice.  Musculoskeletal: Negative for myalgias, back pain, arthralgias, gait problem and neck pain.  Skin: Negative for color change, pallor and rash.  Allergic/Immunologic: Negative.   Neurological: Positive for tremors (Mild jerking of both arms.). Negative for dizziness, syncope, speech difficulty, weakness, numbness and headaches.       Dementia. Likely Alzheimer's disease.  Hematological: Negative for adenopathy. Does not bruise/bleed easily.  Psychiatric/Behavioral: Positive for sleep disturbance. Negative for hallucinations, behavioral problems, confusion and decreased concentration. The patient is not nervous/anxious.     Filed Vitals:   05/07/15 1119  BP: 138/74  Pulse: 70  Temp: 97.5 F (36.4 C)  Resp: 18   Wt Readings from Last 3 Encounters:  12/21/13 166 lb 3.6 oz (75.4 kg)    Body mass index is 22.61 kg/(m^2).  Physical Exam  Constitutional: He is oriented to person, place, and time. No distress.  Then, frail  HENT:  Right Ear: External ear normal.  Left Ear: External ear normal.  Nose: Nose normal.  Mouth/Throat: Oropharynx is clear and moist. No oropharyngeal exudate.  Eyes: Conjunctivae and EOM are normal. Pupils are equal, round, and reactive to light.  Neck: No JVD present. No tracheal deviation present. No thyromegaly present.  Cardiovascular: Normal rate, regular rhythm, normal heart sounds and intact distal pulses.  Exam reveals no gallop and no friction rub.   No murmur heard. Pulmonary/Chest: No respiratory distress. He has no wheezes. He has no rales. He exhibits no tenderness.  Abdominal: He exhibits no distension and no mass. There is no  tenderness.  Musculoskeletal: Normal range of motion. He exhibits no edema or tenderness.  Lymphadenopathy:    He has no cervical adenopathy.  Neurological: He is alert and oriented to person, place, and time. He has normal reflexes. No cranial nerve deficit. Coordination normal.  Mild dementia  Skin: No rash noted. No erythema. No pallor.  Psychiatric: He has a normal mood and affect. His behavior is normal. Thought content normal.     Labs reviewed: Lab Summary Latest Ref Rng 04/21/2015 12/21/2013 12/20/2013 12/19/2013 12/18/2013  Hemoglobin 13.5 - 17.5 g/dL 10.7(A) 10.0(L) 10.3(L) 10.5(L) 10.3(L)  Hematocrit 41 - 53 % 35(A) 30.5(L) 31.2(L) 31.9(L) 29.9(L)  White count - 9.2 1.0(LL) 0.7(LL) 0.6(LL) 0.6(LL)  Platelet count 150 - 400 K/uL (None) 54(L) 43(L) 35(L) 24(LL)  Sodium 137 - 147 mmol/L 145 136(L) 138 137 136(L)  Potassium 3.4 - 5.3 mmol/L 5.2 4.0 4.2 3.8 3.9  Calcium 8.4 - 10.5 mg/dL (None) 8.0(L) 7.9(L)  7.4(L) 7.3(L)  Phosphorus 2.3 - 4.6 mg/dL (None) 3.3 2.4 3.2 4.4  Creatinine .6 - 1.3 mg/dL 3.7(A) 3.38(H) 2.58(H) 3.05(H) 3.87(H)  AST - (None) (None) (None) (None) (None)  Alk Phos - (None) (None) (None) (None) (None)  Bilirubin - (None) (None) (None) (None) (None)  Glucose - 89 180(H) 140(H) 112(H) 103(H)  Cholesterol - (None) (None) (None) (None) (None)  HDL cholesterol - (None) (None) (None) (None) (None)  Triglycerides - (None) (None) (None) (None) (None)  LDL Direct - (None) (None) (None) (None) (None)  LDL Calc - (None) (None) (None) (None) (None)  Total protein - (None) (None) (None) (None) (None)  Albumin 3.5 - 5.2 g/dL (None) 1.8(L) 1.8(L) 1.8(L) 1.9(L)   Lab Results  Component Value Date   TSH 1.430 12/15/2013   Lab Results  Component Value Date   BUN 50* 04/21/2015   BUN 38* 12/21/2013   BUN 28* 12/20/2013   Lab Results  Component Value Date   CREATININE 3.7* 04/21/2015   CREATININE 3.38* 12/21/2013   CREATININE 2.58* 12/20/2013   No results found  for: HGBA1C     Assessment/Plan  1. Tremor I think this is likely related to his end-stage renal disease. No new medications were started  2. CKD (chronic kidney disease) stage 5, GFR less than 15 ml/min [facility]) Patient reaffirms that he does not want to go on dialysis. He is on hospice.   3. Type 2 diabetes mellitus with stage 5 chronic kidney disease not on chronic dialysis, without long-term current use of insulin (HCC) Glucose on recent lab was normal at 89 mg percent  4. Essential hypertension, benign Controlled. Patient has had a significant weight loss. He may be able to do without Tenormin, but I have stopped several other drugs and will reassess this next month.  5. Wheeze Start DuoNeb 1 vial via nebulizer twice a day  6. Insomnia Discontinue melatonin. Use Restoril when necessary  7. Dementia, without behavioral disturbance Continue to follow.  Because of his end-stage renal disease, it seems inappropriate to continue Zocor, so this was discontinued. Patient denies any leg cramps or restless legs, so Requip was discontinued.

## 2015-05-07 NOTE — Progress Notes (Deleted)
HISTORY AND PHYSICAL  Location:  [facility] Room Number: [address] Place of Service: SNF (31)   Extended Emergency Contact Information Primary Emergency Contact: Mittie Bodo [place] of [place] Phone: [phone] Relation: Grandson  Advanced Directive information Does patient have an advance directive?: Yes, Type of Advance Directive: Out of facility DNR (pink MOST or yellow form), Does patient want to make changes to advanced directive?: No - Patient declined  Chief Complaint  Patient presents with  . Medical Management of Chronic Issues    HPI:  ***  Past Medical History  Diagnosis Date  . Hypertension   . Hyperlipidemia   . Emphysema lung ([place])   . Heart failure ([place])   . 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 Anemia 11/2013    Past Surgical History  Procedure Laterality Date  . Cataract extraction    . Renal biopsy, percutaneous  11/2013    at [facility]  . Insertion of dialysis catheter N/A 12/17/2013    Procedure: INSERTION OF DIALYSIS CATHETER RIGHT INTERNAL JUGULAR VEIN;  Surgeon: Mal Misty, MD;  Location: [facility];  Service: Vascular;  Laterality: N/A;    Patient Care Team: Tamsen Roers, MD as PCP - General (Family Medicine)  Social History   Social History  . Marital Status: Married    Spouse Name: N/A  . Number of Children: N/A  . Years of Education: N/A   Occupational History  . Not on file.   Social History Main Topics  . Smoking status: Former Research scientist (life sciences)  . Smokeless tobacco: Former Systems developer  . Alcohol Use: No  . Drug Use: No  . Sexual Activity: Not on file   Other Topics Concern  . Not on file   Social History Narrative    reports that he has quit smoking. He has quit using smokeless tobacco. He reports that he does not drink alcohol or use illicit drugs.  No family history on file. No family status information on file.     There is no immunization history on file for this patient.  No Known  Allergies  Medications: Patient's Medications  New Prescriptions   No medications on file  Previous Medications   ALBUTEROL (PROVENTIL HFA;VENTOLIN HFA) 108 (90 BASE) MCG/ACT INHALER    Inhale 2 puffs into the lungs 3 (three) times daily.   ATENOLOL (TENORMIN) 25 MG TABLET    Take 25 mg by mouth daily.   BUDESONIDE-FORMOTEROL (SYMBICORT) 80-4.5 MCG/ACT INHALER    Inhale 1 puff into the lungs daily.   HYDROCODONE-ACETAMINOPHEN (NORCO) 7.5-325 MG PER TABLET    Take 1 tablet by mouth 2 (two) times daily as needed (for pain).   INSULIN ASPART (NOVOLOG FLEXPEN) 100 UNIT/ML FLEXPEN    Inject 5 Units into the skin 3 (three) times daily with meals. As needed for glucose more then 150   LAMOTRIGINE (LAMICTAL) 25 MG TABLET    Take 25 mg by mouth 2 (two) times daily.   MELATONIN PO    Take 3 mg by mouth at bedtime as needed (sleep).    MULTIVITAMIN (RENA-VIT) TABS TABLET    Take 1 tablet by mouth at bedtime.   NUTRITIONAL SUPPLEMENTS (FEEDING SUPPLEMENT, NEPRO CARB STEADY,) LIQD    Take 237 mLs by mouth 3 (three) times daily between meals.   OMEPRAZOLE (PRILOSEC) 20 MG CAPSULE    Take 20 mg by mouth daily.   POLYETHYLENE GLYCOL (MIRALAX / GLYCOLAX) PACKET    Take  17 g by mouth daily.   ROPINIROLE (REQUIP) 1 MG TABLET    Take 1 mg by mouth 3 (three) times daily.   SIMVASTATIN (ZOCOR) 10 MG TABLET    Take 10 mg by mouth at bedtime.   TEMAZEPAM (RESTORIL) 7.5 MG CAPSULE    Take 1 capsule (7.5 mg total) by mouth at bedtime as needed.  Modified Medications   No medications on file  Discontinued Medications   DARBEPOETIN (ARANESP) 100 MCG/0.5ML SOLN INJECTION    Inject 0.5 mLs (100 mcg total) into the skin every Monday at 6 PM.   DOXERCALCIFEROL (HECTOROL) 4 MCG/2ML INJECTION    Inject 1 mL (2 mcg total) into the vein every Monday, Wednesday, and Friday with hemodialysis.   PREDNISONE (DELTASONE) 10 MG TABLET    Take 1 tablet (10 mg total) by mouth daily with breakfast.   TIOTROPIUM (SPIRIVA) 18 MCG  INHALATION CAPSULE    Place 18 mcg into inhaler and inhale daily.    Review of Systems  Filed Vitals:   05/07/15 1119  BP: 138/74  Pulse: 70  Temp: 97.5 F (36.4 C)  Resp: 18   There is no weight on file to calculate BMI. There were no vitals filed for this visit.   Physical Exam  Labs reviewed: Lab Summary Latest Ref Rng 12/21/2013 12/20/2013 12/19/2013 12/18/2013  Hemoglobin 13.0 - 17.0 g/dL 10.0(L) 10.3(L) 10.5(L) 10.3(L)  Hematocrit 39.0 - 52.0 % 30.5(L) 31.2(L) 31.9(L) 29.9(L)  White count 4.0 - 10.5 K/uL 1.0(LL) 0.7(LL) 0.6(LL) 0.6(LL)  Platelet count 150 - 400 K/uL 54(L) 43(L) 35(L) 24(LL)  Sodium 137 - 147 mEq/L 136(L) 138 137 136(L)  Potassium 3.7 - 5.3 mEq/L 4.0 4.2 3.8 3.9  Calcium 8.4 - 10.5 mg/dL 8.0(L) 7.9(L) 7.4(L) 7.3(L)  Phosphorus 2.3 - 4.6 mg/dL 3.3 2.4 3.2 4.4  Creatinine 0.50 - 1.35 mg/dL 3.38(H) 2.58(H) 3.05(H) 3.87(H)  AST - (None) (None) (None) (None)  Alk Phos - (None) (None) (None) (None)  Bilirubin - (None) (None) (None) (None)  Glucose 70 - 99 mg/dL 180(H) 140(H) 112(H) 103(H)  Cholesterol - (None) (None) (None) (None)  HDL cholesterol - (None) (None) (None) (None)  Triglycerides - (None) (None) (None) (None)  LDL Direct - (None) (None) (None) (None)  LDL Calc - (None) (None) (None) (None)  Total protein - (None) (None) (None) (None)  Albumin 3.5 - 5.2 g/dL 1.8(L) 1.8(L) 1.8(L) 1.9(L)   Lab Results  Component Value Date   BUN 38* 12/21/2013   No results found for: HGBA1C Lab Results  Component Value Date   TSH 1.430 12/15/2013          No results found.   Assessment/Plan

## 2015-05-07 NOTE — Progress Notes (Deleted)
HISTORY AND PHYSICAL  Location:  [facility] Room Number: 207 B Place of Service: SNF (31)   Extended Emergency Contact Information Primary Emergency Contact: Mittie Bodo [place] of [place] Phone: [phone] Relation: Grandson  Advanced Directive information Does patient have an advance directive?: Yes, Type of Advance Directive: Out of facility DNR (pink MOST or yellow form), Does patient want to make changes to advanced directive?: No - Patient declined  Chief Complaint  Patient presents with  . Medical Management of Chronic Issues    c/o diarrhea, refusing medications    HPI:  ***  Past Medical History  Diagnosis Date  . Hypertension   . Hyperlipidemia   . Emphysema lung ([place])   . Heart failure ([place])   . 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 Anemia 11/2013    Past Surgical History  Procedure Laterality Date  . Cataract extraction    . Renal biopsy, percutaneous  11/2013    at [facility]  . Insertion of dialysis catheter N/A 12/17/2013    Procedure: INSERTION OF DIALYSIS CATHETER RIGHT INTERNAL JUGULAR VEIN;  Surgeon: Mal Misty, MD;  Location: [facility];  Service: Vascular;  Laterality: N/A;    Patient Care Team: Tamsen Roers, MD as PCP - General (Family Medicine)  Social History   Social History  . Marital Status: Married    Spouse Name: N/A  . Number of Children: N/A  . Years of Education: N/A   Occupational History  . Not on file.   Social History Main Topics  . Smoking status: Former Research scientist (life sciences)  . Smokeless tobacco: Former Systems developer  . Alcohol Use: No  . Drug Use: No  . Sexual Activity: Not on file   Other Topics Concern  . Not on file   Social History Narrative    reports that he has quit smoking. He has quit using smokeless tobacco. He reports that he does not drink alcohol or use illicit drugs.  No family history on file. No family status information on file.     There is no immunization  history on file for this patient.  No Known Allergies  Medications: Patient's Medications  New Prescriptions   No medications on file  Previous Medications   ALBUTEROL (PROVENTIL HFA;VENTOLIN HFA) 108 (90 BASE) MCG/ACT INHALER    Inhale 2 puffs into the lungs 3 (three) times daily.   ATENOLOL (TENORMIN) 25 MG TABLET    Take 25 mg by mouth daily.   BUDESONIDE-FORMOTEROL (SYMBICORT) 80-4.5 MCG/ACT INHALER    Inhale 1 puff into the lungs daily.   HYDROCODONE-ACETAMINOPHEN (NORCO) 7.5-325 MG PER TABLET    Take 1 tablet by mouth 2 (two) times daily as needed (for pain).   INSULIN ASPART (NOVOLOG FLEXPEN) 100 UNIT/ML FLEXPEN    Inject 5 Units into the skin 3 (three) times daily with meals. As needed for glucose more then 150   LAMOTRIGINE (LAMICTAL) 25 MG TABLET    Take 25 mg by mouth 2 (two) times daily.   MELATONIN PO    Take 3 mg by mouth at bedtime as needed (sleep).    MULTIVITAMIN (RENA-VIT) TABS TABLET    Take 1 tablet by mouth at bedtime.   NUTRITIONAL SUPPLEMENTS (FEEDING SUPPLEMENT, NEPRO CARB STEADY,) LIQD    Take 237 mLs by mouth 3 (three) times daily between meals.   OMEPRAZOLE (PRILOSEC) 20 MG CAPSULE    Take 20 mg by mouth daily.   POLYETHYLENE GLYCOL (MIRALAX /  GLYCOLAX) PACKET    Take 17 g by mouth daily.   ROPINIROLE (REQUIP) 1 MG TABLET    Take 1 mg by mouth 3 (three) times daily.   SIMVASTATIN (ZOCOR) 10 MG TABLET    Take 10 mg by mouth at bedtime.   TEMAZEPAM (RESTORIL) 7.5 MG CAPSULE    Take 1 capsule (7.5 mg total) by mouth at bedtime as needed.  Modified Medications   No medications on file  Discontinued Medications   DARBEPOETIN (ARANESP) 100 MCG/0.5ML SOLN INJECTION    Inject 0.5 mLs (100 mcg total) into the skin every Monday at 6 PM.   DOXERCALCIFEROL (HECTOROL) 4 MCG/2ML INJECTION    Inject 1 mL (2 mcg total) into the vein every Monday, Wednesday, and Friday with hemodialysis.   PREDNISONE (DELTASONE) 10 MG TABLET    Take 1 tablet (10 mg total) by mouth daily with  breakfast.   TIOTROPIUM (SPIRIVA) 18 MCG INHALATION CAPSULE    Place 18 mcg into inhaler and inhale daily.    Review of Systems  Filed Vitals:   05/07/15 1119  BP: 138/74  Pulse: 70  Temp: 97.5 F (36.4 C)  Resp: 18   There is no weight on file to calculate BMI. There were no vitals filed for this visit.   Physical Exam  Labs reviewed: Lab Summary Latest Ref Rng 04/21/2015 12/21/2013 12/20/2013 12/19/2013 12/18/2013  Hemoglobin 13.5 - 17.5 g/dL 10.7(A) 10.0(L) 10.3(L) 10.5(L) 10.3(L)  Hematocrit 41 - 53 % 35(A) 30.5(L) 31.2(L) 31.9(L) 29.9(L)  White count - 9.2 1.0(LL) 0.7(LL) 0.6(LL) 0.6(LL)  Platelet count 150 - 400 K/uL (None) 54(L) 43(L) 35(L) 24(LL)  Sodium 137 - 147 mmol/L 145 136(L) 138 137 136(L)  Potassium 3.4 - 5.3 mmol/L 5.2 4.0 4.2 3.8 3.9  Calcium 8.4 - 10.5 mg/dL (None) 8.0(L) 7.9(L) 7.4(L) 7.3(L)  Phosphorus 2.3 - 4.6 mg/dL (None) 3.3 2.4 3.2 4.4  Creatinine .6 - 1.3 mg/dL 3.7(A) 3.38(H) 2.58(H) 3.05(H) 3.87(H)  AST - (None) (None) (None) (None) (None)  Alk Phos - (None) (None) (None) (None) (None)  Bilirubin - (None) (None) (None) (None) (None)  Glucose - 89 180(H) 140(H) 112(H) 103(H)  Cholesterol - (None) (None) (None) (None) (None)  HDL cholesterol - (None) (None) (None) (None) (None)  Triglycerides - (None) (None) (None) (None) (None)  LDL Direct - (None) (None) (None) (None) (None)  LDL Calc - (None) (None) (None) (None) (None)  Total protein - (None) (None) (None) (None) (None)  Albumin 3.5 - 5.2 g/dL (None) 1.8(L) 1.8(L) 1.8(L) 1.9(L)   Lab Results  Component Value Date   BUN 50* 04/21/2015   No results found for: HGBA1C Lab Results  Component Value Date   TSH 1.430 12/15/2013          No results found.   Assessment/Plan

## 2015-05-24 ENCOUNTER — Non-Acute Institutional Stay (SKILLED_NURSING_FACILITY): Payer: Medicare Other | Admitting: Internal Medicine

## 2015-05-24 DIAGNOSIS — F0391 Unspecified dementia with behavioral disturbance: Secondary | ICD-10-CM | POA: Diagnosis not present

## 2015-05-24 DIAGNOSIS — R531 Weakness: Secondary | ICD-10-CM | POA: Diagnosis not present

## 2015-05-24 DIAGNOSIS — N184 Chronic kidney disease, stage 4 (severe): Secondary | ICD-10-CM

## 2015-05-24 DIAGNOSIS — N185 Chronic kidney disease, stage 5: Secondary | ICD-10-CM

## 2015-05-24 DIAGNOSIS — E1122 Type 2 diabetes mellitus with diabetic chronic kidney disease: Secondary | ICD-10-CM | POA: Diagnosis not present

## 2015-05-24 DIAGNOSIS — D631 Anemia in chronic kidney disease: Secondary | ICD-10-CM

## 2015-05-26 ENCOUNTER — Encounter: Payer: Self-pay | Admitting: Internal Medicine

## 2015-05-26 NOTE — Progress Notes (Signed)
[facility] Room Number: [place] of Service: SNF (31)     No Known Allergies  Chief Complaint  Patient presents with  . Acute Visit    Decline in condition    HPI:  Patient's family requested I evaluate him. Decline in his condition. Poor appetite, sleeping a lot. He seemed weaker to them.  There is no focality to his weakness currently. He is end-stage CKD, previously on dialysis and has elected not to continue with dialysis. He is demented.  He complains of some abdominal discomfort. He has a history of heme positive stools in the past. He denies difficulty with breathing or palpitations. No chest pains.  Medications: Patient's Medications  New Prescriptions   No medications on file  Previous Medications   ALBUTEROL (PROVENTIL HFA;VENTOLIN HFA) 108 (90 BASE) MCG/ACT INHALER    Inhale 2 puffs into the lungs 3 (three) times daily.   ATENOLOL (TENORMIN) 25 MG TABLET    Take 25 mg by mouth daily.   BUDESONIDE-FORMOTEROL (SYMBICORT) 80-4.5 MCG/ACT INHALER    Inhale 1 puff into the lungs daily.   HYDROCODONE-ACETAMINOPHEN (NORCO) 7.5-325 MG PER TABLET    Take 1 tablet by mouth 2 (two) times daily as needed (for pain).   INSULIN ASPART (NOVOLOG FLEXPEN) 100 UNIT/ML FLEXPEN    Inject 5 Units into the skin 3 (three) times daily with meals. As needed for glucose more then 150   IPRATROPIUM-ALBUTEROL (DUONEB) 0.5-2.5 (3) MG/3ML SOLN    Inhale one vial through nebulizer twice daily to help breathing   LAMOTRIGINE (LAMICTAL) 25 MG TABLET    Take 25 mg by mouth 2 (two) times daily.   MELATONIN PO    Take 3 mg by mouth at bedtime as needed (sleep).    MULTIVITAMIN (RENA-VIT) TABS TABLET    Take 1 tablet by mouth at bedtime.   NUTRITIONAL SUPPLEMENTS (FEEDING SUPPLEMENT, NEPRO CARB STEADY,) LIQD    Take 237 mLs by mouth 3 (three) times daily between meals.   OMEPRAZOLE (PRILOSEC) 20 MG CAPSULE    Take 20 mg by mouth daily.   POLYETHYLENE GLYCOL  (MIRALAX / GLYCOLAX) PACKET    Take 17 g by mouth daily.   ROPINIROLE (REQUIP) 1 MG TABLET    Take 1 mg by mouth 3 (three) times daily.   SIMVASTATIN (ZOCOR) 10 MG TABLET    Take 10 mg by mouth at bedtime.   TEMAZEPAM (RESTORIL) 7.5 MG CAPSULE    Take 1 capsule (7.5 mg total) by mouth at bedtime as needed.  Modified Medications   No medications on file  Discontinued Medications   No medications on file     Review of Systems  Constitutional: Positive for activity change, appetite change and fatigue. Negative for fever and unexpected weight change.       Losing weight  HENT: Negative for congestion, ear pain, hearing loss, rhinorrhea, sore throat, tinnitus, trouble swallowing and voice change.   Eyes:       Corrective lenses  Respiratory: Negative for cough, choking, chest tightness, shortness of breath and wheezing.   Cardiovascular: Negative for chest pain, palpitations and leg swelling.  Gastrointestinal: Negative for nausea, abdominal pain, diarrhea, constipation and abdominal distention.  Endocrine: Negative for cold intolerance, heat intolerance, polydipsia, polyphagia and polyuria.  Genitourinary: Negative for dysuria, urgency, frequency and testicular pain.       End-stage renal disease. Now off dialysis per patient choice.  Musculoskeletal: Negative for myalgias, back pain,  arthralgias, gait problem and neck pain.  Skin: Negative for color change, pallor and rash.  Allergic/Immunologic: Negative.   Neurological: Positive for tremors (Mild jerking of both arms.). Negative for dizziness, syncope, speech difficulty, weakness, numbness and headaches.       Dementia. Likely Alzheimer's disease.  Hematological: Negative for adenopathy. Does not bruise/bleed easily.  Psychiatric/Behavioral: Positive for sleep disturbance. Negative for hallucinations, behavioral problems, confusion and decreased concentration. The patient is not nervous/anxious.     Filed Vitals:   05/24/15 1109  BP:  130/61  Pulse: 72  Temp: 97.5 F (36.4 C)  TempSrc: Oral  Resp: 20  Weight: 124 lb (56.246 kg)  SpO2: 99%   Wt Readings from Last 3 Encounters:  05/24/15 124 lb (56.246 kg)  05/07/15 127 lb (57.607 kg)  12/21/13 166 lb 3.6 oz (75.4 kg)    Body mass index is 20.02 kg/(m^2).  Physical Exam  Constitutional: He is oriented to person, place, and time. No distress.  Then, frail  HENT:  Right Ear: External ear normal.  Left Ear: External ear normal.  Nose: Nose normal.  Mouth/Throat: Oropharynx is clear and moist. No oropharyngeal exudate.  Eyes: Conjunctivae and EOM are normal. Pupils are equal, round, and reactive to light.  Neck: No JVD present. No tracheal deviation present. No thyromegaly present.  Cardiovascular: Normal rate, regular rhythm, normal heart sounds and intact distal pulses.  Exam reveals no gallop and no friction rub.   No murmur heard. Pulmonary/Chest: No respiratory distress. He has no wheezes. He has no rales. He exhibits no tenderness.  Abdominal: He exhibits no distension and no mass. There is no tenderness.  Musculoskeletal: Normal range of motion. He exhibits no edema or tenderness.  Lymphadenopathy:    He has no cervical adenopathy.  Neurological: He is alert and oriented to person, place, and time. He has normal reflexes. No cranial nerve deficit. Coordination normal.  Mild dementia  Skin: No rash noted. No erythema. No pallor.  Psychiatric: He has a normal mood and affect. His behavior is normal. Thought content normal.     Labs reviewed: Lab Summary Latest Ref Rng 04/21/2015 12/21/2013 12/20/2013 12/19/2013 12/18/2013  Hemoglobin 13.5 - 17.5 g/dL 10.7(A) 10.0(L) 10.3(L) 10.5(L) 10.3(L)  Hematocrit 41 - 53 % 35(A) 30.5(L) 31.2(L) 31.9(L) 29.9(L)  White count - 9.2 1.0(LL) 0.7(LL) 0.6(LL) 0.6(LL)  Platelet count 150 - 400 K/uL (None) 54(L) 43(L) 35(L) 24(LL)  Sodium 137 - 147 mmol/L 145 136(L) 138 137 136(L)  Potassium 3.4 - 5.3 mmol/L 5.2 4.0 4.2 3.8  3.9  Calcium 8.4 - 10.5 mg/dL (None) 8.0(L) 7.9(L) 7.4(L) 7.3(L)  Phosphorus 2.3 - 4.6 mg/dL (None) 3.3 2.4 3.2 4.4  Creatinine .6 - 1.3 mg/dL 3.7(A) 3.38(H) 2.58(H) 3.05(H) 3.87(H)  AST - (None) (None) (None) (None) (None)  Alk Phos - (None) (None) (None) (None) (None)  Bilirubin - (None) (None) (None) (None) (None)  Glucose - 89 180(H) 140(H) 112(H) 103(H)  Cholesterol - (None) (None) (None) (None) (None)  HDL cholesterol - (None) (None) (None) (None) (None)  Triglycerides - (None) (None) (None) (None) (None)  LDL Direct - (None) (None) (None) (None) (None)  LDL Calc - (None) (None) (None) (None) (None)  Total protein - (None) (None) (None) (None) (None)  Albumin 3.5 - 5.2 g/dL (None) 1.8(L) 1.8(L) 1.8(L) 1.9(L)   Lab Results  Component Value Date   TSH 1.430 12/15/2013   Lab Results  Component Value Date   BUN 50* 04/21/2015   BUN 38* 12/21/2013   BUN 28*  12/20/2013   Lab Results  Component Value Date   CREATININE 3.7* 04/21/2015   CREATININE 3.38* 12/21/2013   CREATININE 2.58* 12/20/2013   No results found for: HGBA1C     Assessment/Plan  1. Weak -CBC, CMP, TSH  2. Type 2 diabetes mellitus with stage 5 chronic kidney disease not on chronic dialysis, without long-term current use of insulin (HCC) -CMP, A1c  3. CKD (chronic kidney disease) stage 5, GFR less than 15 ml/min (HCC) -CMP  4. Dementia, with behavioral disturbance Unchanged  5. Anemia of chronic kidney failure, stage 4 (severe) (HCC) -CBC

## 2015-06-07 ENCOUNTER — Encounter: Payer: Self-pay | Admitting: Nurse Practitioner

## 2015-06-07 ENCOUNTER — Non-Acute Institutional Stay (SKILLED_NURSING_FACILITY): Payer: Medicare Other | Admitting: Nurse Practitioner

## 2015-06-07 DIAGNOSIS — F0281 Dementia in other diseases classified elsewhere with behavioral disturbance: Secondary | ICD-10-CM | POA: Diagnosis not present

## 2015-06-07 DIAGNOSIS — R062 Wheezing: Secondary | ICD-10-CM | POA: Diagnosis not present

## 2015-06-07 DIAGNOSIS — G308 Other Alzheimer's disease: Secondary | ICD-10-CM | POA: Diagnosis not present

## 2015-06-07 DIAGNOSIS — N185 Chronic kidney disease, stage 5: Secondary | ICD-10-CM | POA: Diagnosis not present

## 2015-06-07 DIAGNOSIS — G2581 Restless legs syndrome: Secondary | ICD-10-CM | POA: Diagnosis not present

## 2015-06-07 DIAGNOSIS — F411 Generalized anxiety disorder: Secondary | ICD-10-CM

## 2015-06-07 DIAGNOSIS — G309 Alzheimer's disease, unspecified: Secondary | ICD-10-CM

## 2015-06-07 NOTE — Progress Notes (Signed)
Patient ID: Charles [name], male   DOB: 23-Apr-1940, 75 y.o.   MRN: [id number]  Location:  [facility] Room Number: 207 B Place of Service:  SNF (31)   Aida Puffer, MD  Patient Care Team: Aida Puffer, MD as PCP - General (Family Medicine)  Extended Emergency Contact Information Primary Emergency Contact: Nestor Ramp [place] Home Phone: [phone] Relation: Grandson  Code Status:  DNR  Goals of care: Advanced Directive information Advanced Directives 06/07/2015  Does patient have an advance directive? Yes  Type of Advance Directive Out of facility DNR (pink MOST or yellow form)  Does patient want to make changes to advanced directive? No - Patient declined  Copy of advanced directive(s) in chart? Yes     Chief Complaint  Patient presents with  . Medical Management of Chronic Issues    Routine Visit    HPI:  Pt is a 75 y.o. male seen today for medical management of chronic diseases. Pt with hx of end stage kidney disease previously on dialysis but has stopped dialysis and now decided to be comfort care only and currently on hospice. Staff reports pt does not take his medication. Reports he does not need it. Pt previously living at home with daughter until she was unable to care for him. He has now been at [facility] under hospice care. Hospice recently reports pt with increase complaints on RLS. pts requip was recently stopped and he does report increase restless legs. Pt already reports increased anxiety. Has zoloft but staff reports he is not taking routinely.    Past Medical History  Diagnosis Date  . Hypertension   . Hyperlipidemia   . Emphysema lung (HCC)   . Heart failure (HCC)   . 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 Anemia 11/2013   Past Surgical History  Procedure Laterality Date  . Cataract extraction    . Renal biopsy, percutaneous  11/2013    at [facility]  . Insertion of dialysis catheter N/A 12/17/2013   Procedure: INSERTION OF DIALYSIS CATHETER RIGHT INTERNAL JUGULAR VEIN;  Surgeon: Pryor Ochoa, MD;  Location: [facility] OR;  Service: Vascular;  Laterality: N/A;    No Known Allergies    Medication List       This list is accurate as of: 06/07/15 10:40 AM.  Always use your most recent med list.               acetaminophen 325 MG tablet  Commonly known as:  TYLENOL  Take by mouth every 4 (four) hours as needed.     albuterol 108 (90 Base) MCG/ACT inhaler  Commonly known as:  PROVENTIL HFA;VENTOLIN HFA  Inhale 2 puffs into the lungs 3 (three) times daily.     atenolol 25 MG tablet  Commonly known as:  TENORMIN  Take 25 mg by mouth daily.     feeding supplement (NEPRO CARB STEADY) Liqd  Take 237 mLs by mouth 3 (three) times daily between meals.     haloperidol 5 MG tablet  Commonly known as:  HALDOL  Take 5 mg by mouth every 4 (four) hours as needed for agitation.     lamoTRIgine 25 MG tablet  Commonly known as:  LAMICTAL  Take 25 mg by mouth 2 (two) times daily.     ondansetron 4 MG tablet  Commonly known as:  ZOFRAN  Take 4 mg by mouth 3 (three) times daily as needed for nausea or vomiting.  polyethylene glycol packet  Commonly known as:  MIRALAX / GLYCOLAX  Take 17 g by mouth daily.     sertraline 50 MG tablet  Commonly known as:  ZOLOFT  Take 50 mg by mouth every morning.     temazepam 7.5 MG capsule  Commonly known as:  RESTORIL  Take 1 capsule (7.5 mg total) by mouth at bedtime as needed.        Review of Systems  Constitutional: Positive for fatigue. Negative for fever and unexpected weight change.  HENT: Negative for congestion, ear pain, hearing loss, rhinorrhea, sore throat, tinnitus, trouble swallowing and voice change.   Eyes:       Corrective lenses  Respiratory: Negative for cough, choking, chest tightness, shortness of breath and wheezing.   Cardiovascular: Negative for chest pain, palpitations and leg swelling.  Gastrointestinal: Negative for  nausea, abdominal pain, diarrhea, constipation and abdominal distention.  Endocrine: Negative for cold intolerance, heat intolerance, polydipsia, polyphagia and polyuria.  Genitourinary: Negative for dysuria, urgency, frequency and testicular pain.       End-stage renal disease. Now off dialysis per patient choice.  Musculoskeletal: Negative for myalgias, back pain, arthralgias, gait problem and neck pain.  Skin: Negative for color change, pallor and rash.  Allergic/Immunologic: Negative.   Neurological: Positive for weakness. Negative for dizziness, speech difficulty, numbness and headaches.       Dementia. Likely Alzheimer's disease.  Hematological: Negative for adenopathy. Does not bruise/bleed easily.  Psychiatric/Behavioral: Positive for confusion, sleep disturbance and agitation. Negative for hallucinations, behavioral problems and decreased concentration. The patient is nervous/anxious.      There is no immunization history on file for this patient. Pertinent  Health Maintenance Due  Topic Date Due  . HEMOGLOBIN A1C  1940/10/04  . FOOT EXAM  12/23/1948  . OPHTHALMOLOGY EXAM  12/23/1948  . URINE MICROALBUMIN  12/23/1948  . INFLUENZA VACCINE  07/07/2016 (Originally 11/09/2014)  . PNA vac Low Risk Adult (1 of 2 - PCV13) 07/07/2016 (Originally 12/24/2003)   No flowsheet data found. Functional Status Survey:    Filed Vitals:   06/07/15 1017  BP: 120/84  Pulse: 60  Temp: 97.6 F (36.4 C)  TempSrc: Oral  Resp: 18  Height:  (1.676 m)  Weight: 121 lb (54.885 kg)   Body mass index is 19.54 kg/(m^2). Physical Exam  Constitutional: No distress.  Thin frail male  HENT:  Head: Normocephalic and atraumatic.  Mouth/Throat: Oropharynx is clear and moist. No oropharyngeal exudate.  Eyes: Conjunctivae and EOM are normal. Pupils are equal, round, and reactive to light.  Neck: Normal range of motion. Neck supple.  Cardiovascular: Normal rate, regular rhythm and normal heart  sounds.   Pulmonary/Chest: Effort normal.  diminished breath sounds throughout   Abdominal: Soft. Bowel sounds are normal.  Musculoskeletal: He exhibits no edema or tenderness.  Neurological: He is alert.  Skin: Skin is warm and dry. He is not diaphoretic.  Psychiatric: He has a normal mood and affect.    Labs reviewed:  Recent Labs  04/21/15  NA 145  K 5.2  BUN 50*  CREATININE 3.7*   No results for input(s): AST, ALT, ALKPHOS, BILITOT, PROT, ALBUMIN in the last 8760 hours.  Recent Labs  04/21/15  WBC 9.2  HGB 10.7*  HCT 35*   Lab Results  Component Value Date   TSH 1.430 12/15/2013   No results found for: HGBA1C No results found for: CHOL, HDL, LDLCALC, LDLDIRECT, TRIG, CHOLHDL  Significant Diagnostic Results in last 30  days:  No results found.  Assessment/Plan 1. Restless legs syndrome (RLS) Will restart Requip 1 mg PO TID for restless legs to help with symptoms   2. CKD (chronic kidney disease) stage 5, GFR less than 15 ml/min (HCC) -off dialysis, under hospice cer  3. Wheeze -does not wish to take breathing treatments routinely. Will cont PRN  4. Generalized anxiety disorder -increase anxiety, not taking medication routinely. Will add ativan gel 0.5 mg to skin q 6 hours PRN  5. Alzheimer's dementia with behavioral disturbance, unspecified timing of dementia onset Mild- moderate memory loss. No acute changes in cognitive status noted. Does not wish to take medication. Weight loss noted which as progression of dementia progression    Amias Hutchinson K. Biagio Borg  [facility] [phone] 8 am - 5 pm) [phone] (after hours)

## 2015-06-08 ENCOUNTER — Other Ambulatory Visit: Payer: Self-pay

## 2015-06-08 MED ORDER — AMBULATORY NON FORMULARY MEDICATION: Status: DC

## 2015-06-08 NOTE — Telephone Encounter (Signed)
Refill requested for lorazepam 0.5 mg/ml gel.   Prescription printed and placed in [name]'s folder for signing.

## 2015-06-21 ENCOUNTER — Other Ambulatory Visit: Payer: Self-pay | Admitting: *Deleted

## 2015-06-21 MED ORDER — TEMAZEPAM 7.5 MG PO CAPS: ORAL_CAPSULE | ORAL | Status: DC

## 2015-06-21 NOTE — Telephone Encounter (Signed)
[facility] 

## 2015-06-25 ENCOUNTER — Non-Acute Institutional Stay (SKILLED_NURSING_FACILITY): Payer: Medicare Other | Admitting: Internal Medicine

## 2015-06-25 ENCOUNTER — Encounter: Payer: Self-pay | Admitting: Internal Medicine

## 2015-06-25 DIAGNOSIS — R197 Diarrhea, unspecified: Secondary | ICD-10-CM | POA: Diagnosis not present

## 2015-06-25 NOTE — Progress Notes (Signed)
[facility]  [facility] Room Number: 207-B Place of Service:  SNF [phone](31) Provider: Murray HodgkinsGreen, Markelle Najarian MD  Aida PufferLITTLE,Paton, MD  Patient Care Team: Aida PufferJames Little, MD as PCP - General (Family Medicine)  Extended Emergency Contact Information Primary Emergency Contact: [name],Brandon  [place] Home Phone: [phone] Relation: Grandson  Code Status:  DNR Goals of care: Advanced Directive information Advanced Directives 06/25/2015  Does patient have an advance directive? Yes  Type of Advance Directive Out of facility DNR (pink MOST or yellow form)  Does patient want to make changes to advanced directive? -  Copy of advanced directive(s) in chart? Yes     Chief Complaint  Patient presents with  . Acute Visit    HPI:  Pt is a 75 y.o. male seen today for an acute visit for Reports of diarrhea following eating. No fever. No blood in the stool. Patient simply having postprandial loose stools soon after he eats and more than once daily. Has also complained some of nausea after eating. Patient was started on Lamictal with an increase in dosage on 06/10/2015. Nausea and diarrhea have been reported to occur on this drug sometimes. i amgoing to discontinue this drug. It was started for "mood stabilization" by psychiatric consult   Past Medical History  Diagnosis Date  . Hypertension   . Hyperlipidemia   . Emphysema lung (HCC)   . Heart failure (HCC)   . 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 Anemia 11/2013   Past Surgical History  Procedure Laterality Date  . Cataract extraction    . Renal [facility]  11[facility]2013    [facility]  . Insertion of dialysis catheter N/A 12/17/2013    Procedure: INSERTION OF DIALYSIS CATHETER RIGHT INTERNAL JUGULAR VEIN;  Surgeon: Pryor OchoaJames D Lawson, MD;  [facility];  Service: Vascular;  Laterality: N/A;    No Known Allergies    Medication List       This list is accurate as of: 06/25/15 12:17 PM.  Always use  your most recent med list.               acetaminophen 325 MG tablet  Commonly known as:  TYLENOL  Take by mouth every 4 (four) hours as needed.     albuterol 108 (90 Base) MCG/ACT inhaler  Commonly known as:  PROVENTIL HFA;VENTOLIN HFA  Inhale 2 puffs into the lungs 3 (three) times daily.     AMBULATORY NON FORMULARY MEDICATION  Lorazepam 0.5 mg/ml Gel   Apply 1 ml= 0.5mg  topically to skin every 4 hours as needed for anxiety.     atenolol 25 MG tablet  Commonly known as:  TENORMIN  Take 25 mg by mouth daily.     feeding supplement (NEPRO CARB STEADY) Liqd  Take 237 mLs by mouth 3 (three) times daily between meals.     haloperidol 5 MG tablet  Commonly known as:  HALDOL  Take 5 mg by mouth every 4 (four) hours as needed for agitation.     lamoTRIgine 25 MG tablet  Commonly known as:  LAMICTAL  Take 25 mg by mouth 2 (two) times daily.     ondansetron 4 MG tablet  Commonly known as:  ZOFRAN  Take 4 mg by mouth 3 (three) times daily as needed for nausea or vomiting.     polyethylene glycol packet  Commonly known as:  MIRALAX / GLYCOLAX  Take 17 g by mouth daily.     sertraline  50 MG tablet  Commonly known as:  ZOLOFT  Take 50 mg by mouth every morning.     temazepam 7.5 MG capsule  Commonly known as:  RESTORIL  Take one capsule by mouth every night at bedtime as needed for insomnia        Review of Systems  Constitutional: Positive for activity change, appetite change and fatigue. Negative for fever and unexpected weight change.       Losing weight  HENT: Negative for congestion, ear pain, hearing loss, rhinorrhea, sore throat, tinnitus, trouble swallowing and voice change.   Eyes:       Corrective lenses  Respiratory: Negative for cough, choking, chest tightness, shortness of breath and wheezing.   Cardiovascular: Negative for chest pain, palpitations and leg swelling.  Gastrointestinal: Positive for nausea and diarrhea (loose stools). Negative for abdominal  pain, constipation and abdominal distention.  Endocrine: Negative for cold intolerance, heat intolerance, polydipsia, polyphagia and polyuria.  Genitourinary: Negative for dysuria, urgency, frequency and testicular pain.       End-stage renal disease. Now off dialysis per patient choice.  Musculoskeletal: Negative for myalgias, back pain, arthralgias, gait problem and neck pain.  Skin: Negative for color change, pallor and rash.  Allergic/Immunologic: Negative.   Neurological: Positive for tremors (Mild jerking of both arms.). Negative for dizziness, syncope, speech difficulty, weakness, numbness and headaches.       Dementia. Likely Alzheimer's disease.  Hematological: Negative for adenopathy. Does not bruise/bleed easily.  Psychiatric/Behavioral: Positive for sleep disturbance. Negative for hallucinations, behavioral problems, confusion and decreased concentration. The patient is not nervous/anxious.      There is no immunization history on file for this patient. Pertinent  Health Maintenance Due  Topic Date Due  . HEMOGLOBIN A1C  05/21/1940  . FOOT EXAM  12/23/1948  . OPHTHALMOLOGY EXAM  12/23/1948  . URINE MICROALBUMIN  12/23/1948  . INFLUENZA VACCINE  07/07/2016 (Originally 11/09/2014)  . PNA vac Low Risk Adult (1 of 2 - PCV13) 07/07/2016 (Originally 12/24/2003)   No flowsheet data found. Functional Status Survey:    Filed Vitals:   06/25/15 1207  BP: 132/74  Pulse: 70  Temp: 98.9 F (37.2 C)  TempSrc: Oral  Resp: 18  Height:  (1.676 m)  Weight: 121 lb (54.885 kg)   Body mass index is 19.54 kg/(m^2). Physical Exam  Constitutional: He is oriented to person, place, and time. No distress.  Then, frail  HENT:  Right Ear: External ear normal.  Left Ear: External ear normal.  Nose: Nose normal.  Mouth/Throat: Oropharynx is clear and moist. No oropharyngeal exudate.  Eyes: Conjunctivae and EOM are normal. Pupils are equal, round, and reactive to light.  Neck: No JVD  present. No tracheal deviation present. No thyromegaly present.  Cardiovascular: Normal rate, regular rhythm, normal heart sounds and intact distal pulses.  Exam reveals no gallop and no friction rub.   No murmur heard. Pulmonary/Chest: No respiratory distress. He has no wheezes. He has no rales. He exhibits no tenderness.  Abdominal: He exhibits no distension and no mass. There is no tenderness.  Musculoskeletal: Normal range of motion. He exhibits no edema or tenderness.  Lymphadenopathy:    He has no cervical adenopathy.  Neurological: He is alert and oriented to person, place, and time. He has normal reflexes. No cranial nerve deficit. Coordination normal.  Mild dementia  Skin: No rash noted. No erythema. No pallor.  Psychiatric: He has a normal mood and affect. His behavior is normal. Thought content  normal.    Labs reviewed:  Recent Labs  04/21/15  NA 145  K 5.2  BUN 50*  CREATININE 3.7*   No results for input(s): AST, ALT, ALKPHOS, BILITOT, PROT, ALBUMIN in the last 8760 hours.  Recent Labs  04/21/15  WBC 9.2  HGB 10.7*  HCT 35*   Lab Results  Component Value Date   TSH 1.430 12/15/2013   No results found for: HGBA1C No results found for: CHOL, HDL, LDLCALC, LDLDIRECT, TRIG, CHOLHDL  Significant Diagnostic Results in last 30 days:  No results found.  Assessment/Plan 1. Diarrhea, unspecified type Etiology uncertain. Discontinue Lamictal. Stool for C. difficile toxin assay.

## 2015-07-09 ENCOUNTER — Encounter: Payer: Self-pay | Admitting: Internal Medicine

## 2015-07-09 ENCOUNTER — Other Ambulatory Visit: Payer: Self-pay | Admitting: *Deleted

## 2015-07-09 ENCOUNTER — Non-Acute Institutional Stay (SKILLED_NURSING_FACILITY): Payer: Medicare Other | Admitting: Internal Medicine

## 2015-07-09 DIAGNOSIS — R197 Diarrhea, unspecified: Secondary | ICD-10-CM

## 2015-07-09 MED ORDER — OXYCODONE-ACETAMINOPHEN 5-325 MG PREPACK: ORAL_TABLET | ORAL | Status: DC

## 2015-07-09 MED ORDER — HYOSCYAMINE SULFATE ER 0.375 MG PO TB12: ORAL_TABLET | ORAL | Status: DC

## 2015-07-09 MED ORDER — LOPERAMIDE HCL 2 MG PO TABS: ORAL_TABLET | ORAL | Status: DC

## 2015-07-09 NOTE — Progress Notes (Signed)
Patient ID: Charles [name], male   DOB: 21-Jan-1941, 75 y.o.   MRN: [id number]  Location:  [facility] Room Number: [id number] Place of Service:  SNF (31) Provider:  Kimber Relic, MD  Patient Care Team: Kimber Relic, MD as PCP - General (Internal Medicine)  Extended Emergency Contact Information Primary Emergency Contact: Nestor Ramp [place] Home Phone: [phone] Relation: Grandson  Code Status:  DNR Goals of care: Advanced Directive information Advanced Directives 07/09/2015  Does patient have an advance directive? Yes  Type of Advance Directive Out of facility DNR (pink MOST or yellow form)  Copy of advanced directive(s) in chart? Yes  Pre-existing out of facility DNR order (yellow form or pink MOST form) Yellow form placed in chart (order not valid for inpatient use)     Chief Complaint  Patient presents with  . Acute Visit    medication mangement    HPI:  Pt is a 75 y.o. male seen today for an acute visit for Evaluation of persistence of loose stools. Patient is afebrile. He has some mild to moderate abdominal discomfort. He is having 2-3 loose stools daily. He is not able to associate this with meals. There is no blood in the stool. Review of medications shows that he is on MiraLAX when necessary for laxative, but is not using it.   Past Medical History  Diagnosis Date  . Hypertension   . Hyperlipidemia   . Emphysema lung (HCC)   . Heart failure (HCC)   . 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 Anemia 11/2013   Past Surgical History  Procedure Laterality Date  . Cataract extraction    . Renal biopsy, percutaneous  11/2013    at [facility]  . Insertion of dialysis catheter N/A 12/17/2013    Procedure: INSERTION OF DIALYSIS CATHETER RIGHT INTERNAL JUGULAR VEIN;  Surgeon: Pryor Ochoa, MD;  Location: [facility] OR;  Service: Vascular;  Laterality: N/A;    No Known Allergies    Medication List       This list is accurate  as of: 07/09/15 11:47 AM.  Always use your most recent med list.               acetaminophen 325 MG tablet  Commonly known as:  TYLENOL  Take by mouth every 4 (four) hours as needed.     albuterol 108 (90 Base) MCG/ACT inhaler  Commonly known as:  PROVENTIL HFA;VENTOLIN HFA  Inhale 2 puffs into the lungs 3 (three) times daily.     AMBULATORY NON FORMULARY MEDICATION  Lorazepam 0.5 mg/ml Gel   Apply 1 ml= 0.5mg  topically to skin every 4 hours as needed for anxiety.     atenolol 25 MG tablet  Commonly known as:  TENORMIN  Take 25 mg by mouth daily.     feeding supplement (NEPRO CARB STEADY) Liqd  Take 237 mLs by mouth 3 (three) times daily between meals.     haloperidol 5 MG tablet  Commonly known as:  HALDOL  Take 5 mg by mouth every 4 (four) hours as needed for agitation.     lamoTRIgine 25 MG tablet  Commonly known as:  LAMICTAL  Take 25 mg by mouth 2 (two) times daily.     ondansetron 4 MG tablet  Commonly known as:  ZOFRAN  Take 4 mg by mouth 3 (three) times daily as needed for nausea or vomiting.     oxyCODONE-acetaminophen 5-325 mg Tabs  tablet  Commonly known as:  PERCOCET  Take one tablet by mouth every 6 hours as needed for pain. Do not exceed 4gm of Tylenol in 24 hours     polyethylene glycol packet  Commonly known as:  MIRALAX / GLYCOLAX  Take 17 g by mouth daily.     rOPINIRole 1 MG tablet  Commonly known as:  REQUIP  Take 1 mg by mouth. Take one tablet three times daily     sertraline 50 MG tablet  Commonly known as:  ZOLOFT  Take 50 mg by mouth every morning.     temazepam 7.5 MG capsule  Commonly known as:  RESTORIL  Take one capsule by mouth every night at bedtime as needed for insomnia        Review of Systems  Constitutional: Positive for activity change, appetite change and fatigue. Negative for fever and unexpected weight change.       Losing weight  HENT: Negative for congestion, ear pain, hearing loss, rhinorrhea, sore throat, tinnitus,  trouble swallowing and voice change.   Eyes:       Corrective lenses  Respiratory: Negative for cough, choking, chest tightness, shortness of breath and wheezing.   Cardiovascular: Negative for chest pain, palpitations and leg swelling.  Gastrointestinal: Positive for nausea and diarrhea (loose stools). Negative for abdominal pain, constipation and abdominal distention.  Endocrine: Negative for cold intolerance, heat intolerance, polydipsia, polyphagia and polyuria.  Genitourinary: Negative for dysuria, urgency, frequency and testicular pain.       End-stage renal disease. Now off dialysis per patient choice.  Musculoskeletal: Negative for myalgias, back pain, arthralgias, gait problem and neck pain.  Skin: Negative for color change, pallor and rash.  Allergic/Immunologic: Negative.   Neurological: Positive for tremors (Mild jerking of both arms.). Negative for dizziness, syncope, speech difficulty, weakness, numbness and headaches.       Dementia. Likely Alzheimer's disease.  Hematological: Negative for adenopathy. Does not bruise/bleed easily.  Psychiatric/Behavioral: Positive for sleep disturbance. Negative for hallucinations, behavioral problems, confusion and decreased concentration. The patient is not nervous/anxious.      There is no immunization history on file for this patient. Pertinent  Health Maintenance Due  Topic Date Due  . HEMOGLOBIN A1C  09/22/1938  . FOOT EXAM  12/23/1948  . OPHTHALMOLOGY EXAM  12/23/1948  . URINE MICROALBUMIN  12/23/1948  . INFLUENZA VACCINE  07/07/2016 (Originally 11/09/2014)  . PNA vac Low Risk Adult (1 of 2 - PCV13) 07/07/2016 (Originally 12/24/2003)   No flowsheet data found. Functional Status Survey:    Filed Vitals:   07/09/15 1137  BP: 132/74  Pulse: 70  Temp: 98.9 F (37.2 C)  Resp: 19  Height:  (1.676 m)  Weight: 121 lb (54.885 kg)   Body mass index is 19.54 kg/(m^2). Physical Exam  Constitutional: He is oriented to person,  place, and time. No distress.  Then, frail  HENT:  Right Ear: External ear normal.  Left Ear: External ear normal.  Nose: Nose normal.  Mouth/Throat: Oropharynx is clear and moist. No oropharyngeal exudate.  Eyes: Conjunctivae and EOM are normal. Pupils are equal, round, and reactive to light.  Neck: No JVD present. No tracheal deviation present. No thyromegaly present.  Cardiovascular: Normal rate, regular rhythm, normal heart sounds and intact distal pulses.  Exam reveals no gallop and no friction rub.   No murmur heard. Pulmonary/Chest: No respiratory distress. He has no wheezes. He has no rales. He exhibits no tenderness.  Abdominal: Soft. Bowel sounds  are normal. He exhibits no distension and no mass. There is no tenderness.  Musculoskeletal: Normal range of motion. He exhibits no edema or tenderness.  Lymphadenopathy:    He has no cervical adenopathy.  Neurological: He is alert and oriented to person, place, and time. He has normal reflexes. No cranial nerve deficit. Coordination normal.  Mild dementia  Skin: No rash noted. No erythema. No pallor.  Psychiatric: He has a normal mood and affect. His behavior is normal. Thought content normal.    Labs reviewed:  Recent Labs  04/21/15  NA 145  K 5.2  BUN 50*  CREATININE 3.7*   No results for input(s): AST, ALT, ALKPHOS, BILITOT, PROT, ALBUMIN in the last 8760 hours.  Recent Labs  04/21/15  WBC 9.2  HGB 10.7*  HCT 35*   Lab Results  Component Value Date   TSH 1.430 12/15/2013   Assessment/Plan 1. Diarrhea, unspecified type Etiology of his problem is unclear. I suspect a spastic gut problem, but there are multiple other potential etiologies. Discussed patient's current condition and new medications with his daughter Vance GatherShelley  - hyoscyamine (LEVBID) 0.375 MG 12 hr tablet; 1 every 12 hours to reduce abdominal discomfort  Dispense: 60 tablet; Refill: 5 - loperamide (IMODIUM A-D) 2 MG tablet; 1 each morning to control  diarrhea  Dispense: 30 tablet; Refill: 5

## 2015-07-09 NOTE — Telephone Encounter (Signed)
[facility] 

## 2015-07-12 ENCOUNTER — Other Ambulatory Visit: Payer: Self-pay | Admitting: *Deleted

## 2015-07-12 MED ORDER — TEMAZEPAM 15 MG PO CAPS: ORAL_CAPSULE | ORAL | Status: DC

## 2015-07-12 NOTE — Telephone Encounter (Signed)
[facility] 

## 2015-07-16 ENCOUNTER — Other Ambulatory Visit: Payer: Self-pay | Admitting: *Deleted

## 2015-07-16 MED ORDER — LORAZEPAM 0.5 MG PO TABS: ORAL_TABLET | ORAL | Status: DC

## 2015-07-16 NOTE — Telephone Encounter (Signed)
[facility] 

## 2015-07-19 ENCOUNTER — Encounter: Payer: Self-pay | Admitting: Internal Medicine

## 2015-07-19 ENCOUNTER — Non-Acute Institutional Stay (SKILLED_NURSING_FACILITY): Payer: Medicare Other | Admitting: Internal Medicine

## 2015-07-19 DIAGNOSIS — K4090 Unilateral inguinal hernia, without obstruction or gangrene, not specified as recurrent: Secondary | ICD-10-CM

## 2015-07-19 HISTORY — DX: Unilateral inguinal hernia, without obstruction or gangrene, not specified as recurrent: K40.90

## 2015-07-19 NOTE — Progress Notes (Signed)
Patient ID: Anola GurneyJames A Bittinger, male   DOB: Mar 13, 1941, 75 y.o.   MRN: [id number]  [facility]  [facility] Room Number: [id number] Place of Service:  SNF (31) Provider: Kimber RelicGREEN, Hermenia Fritcher G, MD  Patient Care Team: Kimber RelicArthur G Damion Kant, MD as PCP - General (Internal Medicine)  Extended Emergency Contact Information Primary Emergency Contact: [name],Brandon  [place] Home Phone: [phone] Relation: Grandson  Code Status:  DO NOT RESUSCITATE Goals of care: Advanced Directive information Advanced Directives 07/19/2015  Does patient have an advance directive? Yes  Type of Advance Directive Out of facility DNR (pink MOST or yellow form)  Copy of advanced directive(s) in chart? Yes  Pre-existing out of facility DNR order (yellow form or pink MOST form) Yellow form placed in chart (order not valid for inpatient use)     Chief Complaint  Patient presents with  . Acute Visit    large harden area around pubic area    HPI:  Pt is a 75 y.o. male seen today for an acute visit for Evaluation of a mass in the right groin. Patient, by his own history today, has had this is a recurrent problem. The last time he got this large he denies constipation. was about for 5 years ago. He has thought about having surgery torepair the right inguinal hernia, but never followed through.. There is been no vomiting, fever, or nausea.   Past Medical History  Diagnosis Date  . Hypertension   . Hyperlipidemia   . Emphysema lung (HCC)   . Heart failure (HCC)   . 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 Anemia 11/2013   Past Surgical History  Procedure Laterality Date  . Cataract extraction    . [facility]  . Insertion of dialysis catheter N/A 12/17/2013    Procedure: INSERTION OF DIALYSIS CATHETER RIGHT INTERNAL JUGULAR VEIN;  Surgeon: Pryor OchoaJames D Lawson, MD;  [facility];  Service: Vascular;  Laterality: N/A;    Allergies  Allergen  Reactions  . Influenza Vaccines       Medication List       This list is accurate as of: 07/19/15  4:34 PM.  Always use your most recent med list.               acetaminophen 325 MG tablet  Commonly known as:  TYLENOL  Take by mouth every 4 (four) hours as needed.     albuterol 108 (90 Base) MCG/ACT inhaler  Commonly known as:  PROVENTIL HFA;VENTOLIN HFA  Inhale 2 puffs into the lungs 3 (three) times daily.     AMBULATORY NON FORMULARY MEDICATION  Lorazepam 0.5 mg/ml Gel   Apply 1 ml= 0.5mg  topically to skin every 4 hours as needed for anxiety.     atenolol 25 MG tablet  Commonly known as:  TENORMIN  Take 25 mg by mouth daily.     feeding supplement (NEPRO CARB STEADY) Liqd  Take 237 mLs by mouth 3 (three) times daily between meals.     hyoscyamine 0.375 MG 12 hr tablet  Commonly known as:  LEVBID  1 every 12 hours to reduce abdominal discomfort     lamoTRIgine 25 MG tablet  Commonly known as:  LAMICTAL  Take 50 mg by mouth 2 (two) times daily.     loperamide 2 MG tablet  Commonly known as:  IMODIUM A-D  1 each morning to control diarrhea  LORazepam 0.5 MG tablet  Commonly known as:  ATIVAN  Take one tablet by mouth three times daily for anxiety     oxyCODONE-acetaminophen 5-325 mg Tabs tablet  Commonly known as:  PERCOCET  Take one tablet by mouth every 6 hours as needed for pain. Do not exceed 4gm of Tylenol in 24 hours     promethazine 25 MG tablet  Commonly known as:  PHENERGAN  Take 25 mg by mouth every 6 (six) hours as needed for nausea or vomiting.     rOPINIRole 1 MG tablet  Commonly known as:  REQUIP  Take 1 mg by mouth. Take one tablet three times daily     temazepam 15 MG capsule  Commonly known as:  RESTORIL  Take one capsule by mouth every night at bedtime for insomnia        Review of Systems  Constitutional: Positive for activity change, appetite change and fatigue. Negative for fever and unexpected weight change.       Losing  weight  HENT: Negative for congestion, ear pain, hearing loss, rhinorrhea, sore throat, tinnitus, trouble swallowing and voice change.   Eyes:       Corrective lenses  Respiratory: Negative for cough, choking, chest tightness, shortness of breath and wheezing.   Cardiovascular: Negative for chest pain, palpitations and leg swelling.  Gastrointestinal: Positive for nausea and diarrhea (loose stools). Negative for abdominal pain, constipation and abdominal distention.       Mildly tender right inguinal mass measuring about 3 x 4" diameter.  Endocrine: Negative for cold intolerance, heat intolerance, polydipsia, polyphagia and polyuria.  Genitourinary: Negative for dysuria, urgency, frequency and testicular pain.       End-stage renal disease. Now off dialysis per patient choice.  Musculoskeletal: Negative for myalgias, back pain, arthralgias, gait problem and neck pain.  Skin: Negative for color change, pallor and rash.  Allergic/Immunologic: Negative.   Neurological: Positive for tremors (Mild jerking of both arms.). Negative for dizziness, syncope, speech difficulty, weakness, numbness and headaches.       Dementia. Likely Alzheimer's disease.  Hematological: Negative for adenopathy. Does not bruise/bleed easily.  Psychiatric/Behavioral: Positive for sleep disturbance. Negative for hallucinations, behavioral problems, confusion and decreased concentration. The patient is not nervous/anxious.      There is no immunization history on file for this patient. Pertinent  Health Maintenance Due  Topic Date Due  . HEMOGLOBIN A1C  06-[birth year]-1938  . FOOT EXAM  12/23/1948  . OPHTHALMOLOGY EXAM  12/23/1948  . URINE MICROALBUMIN  12/23/1948  . INFLUENZA VACCINE  07/07/2016 (Originally 11/09/2015)  . PNA vac Low Risk Adult (1 of 2 - PCV13) 07/07/2016 (Originally 12/24/2003)   No flowsheet data found. Functional Status Survey:    Filed Vitals:   07/19/15 1621  BP: 132/76  Pulse: 68  Temp: 98.3 F  (36.8 C)  Resp: 19  Height:  (1.676 m)  Weight: 121 lb (54.885 kg)   Body mass index is 19.54 kg/(m^2). Physical Exam  Constitutional: He is oriented to person, place, and time. No distress.  Then, frail  HENT:  Right Ear: External ear normal.  Left Ear: External ear normal.  Nose: Nose normal.  Mouth/Throat: Oropharynx is clear and moist. No oropharyngeal exudate.  Eyes: Conjunctivae and EOM are normal. Pupils are equal, round, and reactive to light.  Neck: No JVD present. No tracheal deviation present. No thyromegaly present.  Cardiovascular: Normal rate, regular rhythm, normal heart sounds and intact distal pulses.  Exam reveals no gallop  and no friction rub.   No murmur heard. Pulmonary/Chest: No respiratory distress. He has no wheezes. He has no rales. He exhibits no tenderness.  Abdominal: Soft. Bowel sounds are normal. He exhibits no distension and no mass. There is no tenderness.  Right inguinal mass measuring 3 x 4 cm. Mildly tender on palpation. Inguinal hernia is reducible, and the mass resolved after gentle pressure for approximately 5 minutes. No other abdominal masses noted.  Musculoskeletal: Normal range of motion. He exhibits no edema or tenderness.  Lymphadenopathy:    He has no cervical adenopathy.  Neurological: He is alert and oriented to person, place, and time. He has normal reflexes. No cranial nerve deficit. Coordination normal.  Mild dementia  Skin: No rash noted. No erythema. No pallor.  Psychiatric: He has a normal mood and affect. His behavior is normal. Thought content normal.    Labs reviewed:  Recent Labs  04/21/15  NA 145  K 5.2  BUN 50*  CREATININE 3.7*   No results for input(s): AST, ALT, ALKPHOS, BILITOT, PROT, ALBUMIN in the last 8760 hours.  Recent Labs  04/21/15  WBC 9.2  HGB 10.7*  HCT 35*   Lab Results  Component Value Date   TSH 1.430 12/15/2013    Assessment/Plan 1. Unilateral inguinal hernia without obstruction or  gangrene, recurrence not specified This patient is not a good surgical candidate. By his history, he has had an inguinal hernia for at least 4-5 years. There were no complaints of pain, discomfort, nausea, abdominal distention prior to today. None of these complaints existed today despite the mass representing his right inguinal hernia. Patient is a very poor surgical candidate because of his renal failure. I have told him that I do not believe this problem is one that requires surgical intervention because of the risk. Of course, if he began having excruciating pain, thent he should go to the emergency room for emergency surgery evaluation.

## 2015-07-21 NOTE — Addendum Note (Signed)
Addended by: Kimber RelicGREEN, ARTHUR G on: 07/21/2015 05:11 PM   Modules accepted: Level of Service

## 2015-07-28 ENCOUNTER — Encounter: Payer: Self-pay | Admitting: Nurse Practitioner

## 2015-07-28 ENCOUNTER — Non-Acute Institutional Stay (SKILLED_NURSING_FACILITY): Payer: Medicare Other | Admitting: Nurse Practitioner

## 2015-07-28 DIAGNOSIS — R251 Tremor, unspecified: Secondary | ICD-10-CM | POA: Diagnosis not present

## 2015-07-28 DIAGNOSIS — G4700 Insomnia, unspecified: Secondary | ICD-10-CM | POA: Diagnosis not present

## 2015-07-28 DIAGNOSIS — D631 Anemia in chronic kidney disease: Secondary | ICD-10-CM | POA: Diagnosis not present

## 2015-07-28 DIAGNOSIS — N184 Chronic kidney disease, stage 4 (severe): Secondary | ICD-10-CM

## 2015-07-28 DIAGNOSIS — F0391 Unspecified dementia with behavioral disturbance: Secondary | ICD-10-CM

## 2015-07-28 DIAGNOSIS — R062 Wheezing: Secondary | ICD-10-CM | POA: Diagnosis not present

## 2015-07-28 DIAGNOSIS — I10 Essential (primary) hypertension: Secondary | ICD-10-CM | POA: Diagnosis not present

## 2015-07-28 DIAGNOSIS — S32010S Wedge compression fracture of first lumbar vertebra, sequela: Secondary | ICD-10-CM

## 2015-07-28 DIAGNOSIS — R197 Diarrhea, unspecified: Secondary | ICD-10-CM | POA: Diagnosis not present

## 2015-07-28 DIAGNOSIS — F330 Major depressive disorder, recurrent, mild: Secondary | ICD-10-CM

## 2015-07-28 DIAGNOSIS — N185 Chronic kidney disease, stage 5: Secondary | ICD-10-CM

## 2015-08-02 ENCOUNTER — Non-Acute Institutional Stay (SKILLED_NURSING_FACILITY): Payer: Medicare Other | Admitting: Internal Medicine

## 2015-08-02 ENCOUNTER — Encounter: Payer: Self-pay | Admitting: Internal Medicine

## 2015-08-02 DIAGNOSIS — N185 Chronic kidney disease, stage 5: Secondary | ICD-10-CM

## 2015-08-02 DIAGNOSIS — N189 Chronic kidney disease, unspecified: Secondary | ICD-10-CM

## 2015-08-02 DIAGNOSIS — E1122 Type 2 diabetes mellitus with diabetic chronic kidney disease: Secondary | ICD-10-CM

## 2015-08-02 DIAGNOSIS — D631 Anemia in chronic kidney disease: Secondary | ICD-10-CM

## 2015-08-02 DIAGNOSIS — F3341 Major depressive disorder, recurrent, in partial remission: Secondary | ICD-10-CM

## 2015-08-02 DIAGNOSIS — F0390 Unspecified dementia without behavioral disturbance: Secondary | ICD-10-CM

## 2015-08-02 DIAGNOSIS — F329 Major depressive disorder, single episode, unspecified: Secondary | ICD-10-CM | POA: Insufficient documentation

## 2015-08-02 MED ORDER — SERTRALINE HCL 100 MG PO TABS: ORAL_TABLET | ORAL | Status: DC

## 2015-08-02 NOTE — Assessment & Plan Note (Signed)
No apparent tremor noted, continue Requip 1mg  tid.

## 2015-08-02 NOTE — Assessment & Plan Note (Signed)
Controlled, continue Hyoscyamine 0.375mg  q12h, Imodium 2mg  qam

## 2015-08-02 NOTE — Assessment & Plan Note (Signed)
Apparently he has increased confusion, reported hallucination, will obtain CXR, UA C/S, CBC, CMP

## 2015-08-02 NOTE — Assessment & Plan Note (Signed)
Prn Norco available to him for pain management.

## 2015-08-02 NOTE — Assessment & Plan Note (Signed)
04/21/15 Hgb 10.7, Na 145, K 5.2, Bun 50, creat 3.7

## 2015-08-02 NOTE — Assessment & Plan Note (Signed)
04/21/15 Hgb 10.7, Na 145, K 5.2, Bun 50, creat 3.7  

## 2015-08-02 NOTE — Progress Notes (Signed)
Patient ID: Charles [name], male   DOB: [birth year], 75 y.o.   MRN: [id number]  Location:  [facility] Room Number: 207B Place of Service:  SNF (31) Provider:  Kimber Relic, MD  Patient Care Team: Kimber Relic, MD as PCP - General (Internal Medicine)  Extended Emergency Contact Information Primary Emergency Contact: Nestor Ramp [place] Home Phone: [phone] Relation: Grandson  Code Status:  DO NOT RESUSCITATE Goals of care: Advanced Directive information Advanced Directives 08/02/2015  Does patient have an advance directive? Yes  Type of Advance Directive Out of facility DNR (pink MOST or yellow form)  Does patient want to make changes to advanced directive? -  Copy of advanced directive(s) in chart? -  Pre-existing out of facility DNR order (yellow form or pink MOST form) Yellow form placed in chart (order not valid for inpatient use)     Chief Complaint  Patient presents with  . Acute Visit    confusion  . Abdominal Pain    loose stools    HPI:  Pt is a 75 y.o. male seen today for an acute visit for Staff reports an episode of increased confusion. Patient was reaching for something that was not there. His family was present at the time concerned them. He has taking Ativan 0.5 mg 3 times daily. He also was started recently on hyoscyamine for his abdominal distress including discomfort and diarrhea. Patient continues to report loose stools about once daily, but tells me today that his stooling has improved. His abdominal pain also has improved.  Staff also reports that in order for C. difficile toxin assay requested last month was never done. I have reordered this today.  Patient is conversant and not confused when I spoke with the today. He reports improvement in his abdominal symptoms to me although he continues to report loose stool daily and some abdominal distress in the right lower quadrant to the  staff.  Appetite remains poor.  Patient was recently seen by psychiatric services and the Zoloft was increased to 100 mg daily from the previous dose of 50 mg.  Patient remains on hospice services.   Past Medical History  Diagnosis Date  . Hypertension   . Hyperlipidemia   . Emphysema lung (HCC)   . Heart failure (HCC)   . 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 Anemia 11/2013  . Inguinal hernia 07/19/2015   Past Surgical History  Procedure Laterality Date  . Cataract extraction    . Renal biopsy, percutaneous  11/2013    at [facility]  . Insertion of dialysis catheter N/A 12/17/2013    Procedure: INSERTION OF DIALYSIS CATHETER RIGHT INTERNAL JUGULAR VEIN;  Surgeon: Pryor Ochoa, MD;  Location: [facility] OR;  Service: Vascular;  Laterality: N/A;    Allergies  Allergen Reactions  . Influenza Vaccines       Medication List       This list is accurate as of: 08/02/15 11:39 AM.  Always use your most recent med list.               acetaminophen 325 MG tablet  Commonly known as:  TYLENOL  Take by mouth every 4 (four) hours as needed.     albuterol 108 (90 Base) MCG/ACT inhaler  Commonly known as:  PROVENTIL HFA;VENTOLIN HFA  Inhale 2 puffs into the lungs 3 (three) times daily.     AMBULATORY NON FORMULARY MEDICATION  Lorazepam 0.5 mg/ml Gel  Apply 1 ml= 0.5mg  topically to skin every 4 hours as needed for anxiety.     atenolol 25 MG tablet  Commonly known as:  TENORMIN  Take 25 mg by mouth daily.     feeding supplement (NEPRO CARB STEADY) Liqd  Take 237 mLs by mouth 3 (three) times daily between meals.     hyoscyamine 0.375 MG 12 hr tablet  Commonly known as:  LEVBID  1 every 12 hours to reduce abdominal discomfort     ipratropium-albuterol 0.5-2.5 (3) MG/3ML Soln  Commonly known as:  DUONEB  Take 3 mLs by nebulization 2 (two) times daily. For SOB     loperamide 2 MG tablet  Commonly known as:  IMODIUM A-D  1 each morning to control diarrhea     LORazepam 0.5 MG  tablet  Commonly known as:  ATIVAN  Take one tablet by mouth three times daily for anxiety     oxyCODONE-acetaminophen 5-325 mg Tabs tablet  Commonly known as:  PERCOCET  Take one tablet by mouth every 6 hours as needed for pain. Do not exceed 4gm of Tylenol in 24 hours     promethazine 25 MG tablet  Commonly known as:  PHENERGAN  Take 25 mg by mouth every 6 (six) hours as needed for nausea or vomiting.     rOPINIRole 1 MG tablet  Commonly known as:  REQUIP  Take 1 mg by mouth. Take one tablet three times daily     sertraline 50 MG tablet  Commonly known as:  ZOLOFT  Take 50 mg by mouth. Take one tablet in morning     temazepam 15 MG capsule  Commonly known as:  RESTORIL  Take one capsule by mouth every night at bedtime for insomnia        Review of Systems  Constitutional: Positive for activity change, appetite change and fatigue. Negative for fever and unexpected weight change.       Losing weight  HENT: Negative for congestion, ear pain, hearing loss, rhinorrhea, sore throat, tinnitus, trouble swallowing and voice change.   Eyes:       Corrective lenses  Respiratory: Negative for cough, choking, chest tightness, shortness of breath and wheezing.   Cardiovascular: Negative for chest pain, palpitations and leg swelling.  Gastrointestinal: Positive for nausea, abdominal pain and diarrhea (loose stools). Negative for constipation and abdominal distention.       Mildly tender right inguinal mass measuring about 3 x 4" diameter.  Endocrine: Negative for cold intolerance, heat intolerance, polydipsia, polyphagia and polyuria.  Genitourinary: Negative for dysuria, urgency, frequency and testicular pain.       End-stage renal disease. Now off dialysis per patient choice.  Musculoskeletal: Negative for myalgias, back pain, arthralgias, gait problem and neck pain.  Skin: Negative for color change, pallor and rash.  Allergic/Immunologic: Negative.   Neurological: Positive for  tremors (Mild jerking of both arms.). Negative for dizziness, syncope, speech difficulty, weakness, numbness and headaches.       Dementia. Likely Alzheimer's disease.  Hematological: Negative for adenopathy. Does not bruise/bleed easily.       History of anemia  Psychiatric/Behavioral: Positive for sleep disturbance. Negative for hallucinations, behavioral problems and decreased concentration. The patient is not nervous/anxious.      There is no immunization history on file for this patient. Pertinent  Health Maintenance Due  Topic Date Due  . HEMOGLOBIN A1C  12/12/38  . FOOT EXAM  12/23/1948  . OPHTHALMOLOGY EXAM  12/23/1948  . URINE MICROALBUMIN  12/23/1948  . INFLUENZA VACCINE  07/07/2016 (Originally 11/09/2015)  . PNA vac Low Risk Adult (1 of 2 - PCV13) 07/07/2016 (Originally 12/24/2003)   No flowsheet data found. Functional Status Survey:    Filed Vitals:   08/02/15 1128  BP: 122/78  Pulse: 66  Temp: 98.6 F (37 C)  Resp: 20  Height:  (1.702 m)  Weight: 123 lb (55.792 kg)  SpO2: 99%   Body mass index is 19.26 kg/(m^2). Physical Exam  Constitutional: He is oriented to person, place, and time. No distress.  Then, frail  HENT:  Right Ear: External ear normal.  Left Ear: External ear normal.  Nose: Nose normal.  Mouth/Throat: Oropharynx is clear and moist. No oropharyngeal exudate.  Eyes: Conjunctivae and EOM are normal. Pupils are equal, round, and reactive to light.  Neck: No JVD present. No tracheal deviation present. No thyromegaly present.  Cardiovascular: Normal rate, regular rhythm, normal heart sounds and intact distal pulses.  Exam reveals no gallop and no friction rub.   No murmur heard. Pulmonary/Chest: No respiratory distress. He has no wheezes. He has no rales. He exhibits no tenderness.  Abdominal: Soft. Bowel sounds are normal. He exhibits no distension and no mass. There is no tenderness.  Right inguinal mass measuring 3 x 4 cm. Mildly tender on  palpation. Inguinal hernia is reducible, and the mass resolved after gentle pressure for approximately 5 minutes. No other abdominal masses noted.  Musculoskeletal: Normal range of motion. He exhibits no edema or tenderness.  Lymphadenopathy:    He has no cervical adenopathy.  Neurological: He is alert and oriented to person, place, and time. He has normal reflexes. No cranial nerve deficit. Coordination normal.  Mild dementia  Skin: No rash noted. No erythema. No pallor.  Psychiatric: He has a normal mood and affect. His behavior is normal. Thought content normal.    Labs reviewed:  Recent Labs  04/21/15  NA 145  K 5.2  BUN 50*  CREATININE 3.7*   No results for input(s): AST, ALT, ALKPHOS, BILITOT, PROT, ALBUMIN in the last 8760 hours.  Recent Labs  04/21/15  WBC 9.2  HGB 10.7*  HCT 35*   Lab Results  Component Value Date   TSH 1.430 12/15/2013   Ordered 07/28/2015: CBC, CMP  07/29/2015 urinalysis: Unremarkable  Significant Diagnostic Results in last 30 days:  Chest x-ray done 07/29/2015 showed no acute problems.  Assessment/Plan 1. Dementia, without behavioral disturbance Associated with recent acute confusional episode. I have elected not to change medications at this time.  2. Type 2 diabetes mellitus with stage 5 chronic kidney disease not on chronic dialysis, without long-term current use of insulin (HCC) -CMP hemoglobin A1c,  3. CKD (chronic kidney disease) stage 5, GFR less than 15 ml/min (HCC) -CMP  4. Anemia of chronic kidney failure, unspecified stage -CBC

## 2015-08-02 NOTE — Assessment & Plan Note (Signed)
Stable, continue Sertraline 100mg daily, Lorazepam 0.5mg tid, Lorazepam gel 0.5mg prn, Temazepam 15mg qhs  

## 2015-08-02 NOTE — Assessment & Plan Note (Signed)
Stable, continue Sertraline 100mg  daily, Lorazepam 0.5mg  tid, Lorazepam gel 0.5mg  prn, Temazepam 15mg  qhs

## 2015-08-02 NOTE — Assessment & Plan Note (Signed)
07/02/15 CXR no acute cardiopulmonary disease.  Noted mild diffused expiratory wheezes post mid to lower lungs, takes Duoneb tid, Albuterol HFA 2 puffs tid, update CXR in setting of reported hallucination and increased confusion

## 2015-08-02 NOTE — Assessment & Plan Note (Signed)
Blood pressure is controlled, continue Atenolol 25mg daily.   

## 2015-08-02 NOTE — Progress Notes (Signed)
Patient ID: Charles [name], male   DOB: 08/23/1940, 75 y.o.   MRN: [id number]  Location:  [facility] Room Number: 207 [id number] Place of Service:  SNF (31) Provider: Arna SnipeManXie Ranata Laughery NP  [name], Charles CurtARTHUR G, MD  Patient Care Team: Kimber RelicArthur [name] Green, MD as PCP - General (Internal Medicine)  Extended Emergency Contact Information Primary Emergency Contact: Charles [name],Charles  [place] Home Phone: [phone] Relation: Grandson  Code Status:  DNR Goals of care: Advanced Directive information Advanced Directives 08/02/2015  Does patient have an advance directive? Yes  Type of Advance Directive Out of facility DNR (pink MOST or yellow form)  Does patient want to make changes to advanced directive? -  Copy of advanced directive(s) in chart? -  Pre-existing out of facility DNR order (yellow form or pink MOST form) Yellow form placed in chart (order not valid for inpatient use)     Chief Complaint  Patient presents with  . Altered Mental Status    patient complains of hallucinations    HPI:  Pt is a 75 y.o. male seen today for medical management of chronic diseases.  Acute visit for reported hallucination and increased confusion in addition to his baseline dementia. Denied pain or SOB, but he was found to have mild diffused expiratory wheezes upon my visit, no noted fever or O2 desaturation,.   Hx of HTN, controlled on Atenolol 25mg  daily, no apparent tremor while on Requip 1mg  tid, his mood is stable on Sertraline 100mg , Lorazepam 0.5mg  tid and prn gel, Temazepam 15mg  hs, chronic diarrhea is stable on Hyoscyamine 0.375mg  q12h, Imodium 2mg  qam. Mild wheezes is controlled on Duneb tid and Albuterol HFA 2 puffs tid. Hx of dementia, resides in SNF for care needs.    Past Medical History  Diagnosis Date  . Hypertension   . Hyperlipidemia   . Emphysema lung (HCC)   . Heart failure (HCC)   . 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 Anemia 11/2013  . Inguinal  hernia 07/19/2015   Past Surgical History  Procedure Laterality Date  . Cataract extraction    . [facility]  . Insertion of dialysis catheter N/A 12/17/2013    Procedure: INSERTION OF DIALYSIS CATHETER RIGHT INTERNAL JUGULAR VEIN;  Surgeon: Charles OchoaJames D Lawson, MD;  [facility];  Service: Vascular;  Laterality: N/A;    Allergies  Allergen Reactions  . Influenza Vaccines       Medication List       This list is accurate as of: 07/28/15 11:59 PM.  Always use your most recent med list.               acetaminophen 325 MG tablet  Commonly known as:  TYLENOL  Take by mouth every 4 (four) hours as needed.     albuterol 108 (90 Base) MCG/ACT inhaler  Commonly known as:  PROVENTIL HFA;VENTOLIN HFA  Inhale 2 puffs into the lungs 3 (three) times daily.     AMBULATORY NON FORMULARY MEDICATION  Lorazepam 0.5 mg/ml Gel   Apply 1 ml= 0.5mg  topically to skin every 4 hours as needed for anxiety.     atenolol 25 MG tablet  Commonly known as:  TENORMIN  Take 25 mg by mouth daily.     feeding supplement (NEPRO CARB STEADY) Liqd  Take 237 mLs by mouth 3 (three) times daily between meals.     hyoscyamine 0.375 MG 12 hr tablet  Commonly known as:  LEVBID  1 every 12 hours to reduce abdominal discomfort     ipratropium-albuterol 0.5-2.5 (3) MG/3ML Soln  Commonly known as:  DUONEB  Take 3 mLs by nebulization 2 (two) times daily. For SOB     loperamide 2 MG tablet  Commonly known as:  IMODIUM A-D  1 each morning to control diarrhea     LORazepam 0.5 MG tablet  Commonly known as:  ATIVAN  Take one tablet by mouth three times daily for anxiety     oxyCODONE-acetaminophen 5-325 mg Tabs tablet  Commonly known as:  PERCOCET  Take one tablet by mouth every 6 hours as needed for pain. Do not exceed 4gm of Tylenol in 24 hours     promethazine 25 MG tablet  Commonly known as:  PHENERGAN  Take 25 mg by mouth every 6 (six) hours as needed for nausea or  vomiting.     rOPINIRole 1 MG tablet  Commonly known as:  REQUIP  Take 1 mg by mouth. Take one tablet three times daily     temazepam 15 MG capsule  Commonly known as:  RESTORIL  Take one capsule by mouth every night at bedtime for insomnia        Review of Systems  Constitutional: Positive for activity change, appetite change and fatigue. Negative for fever and unexpected weight change.       Losing weight  HENT: Negative for congestion, ear pain, hearing loss, rhinorrhea, sore throat, tinnitus, trouble swallowing and voice change.   Eyes:       Corrective lenses  Respiratory: Negative for cough, choking, chest tightness, shortness of breath and wheezing.   Cardiovascular: Negative for chest pain, palpitations and leg swelling.  Gastrointestinal: Positive for diarrhea (loose stools). Negative for nausea, abdominal pain, constipation and abdominal distention.       Mildly tender right inguinal mass measuring about 3 x 4" diameter.  Endocrine: Negative for cold intolerance, heat intolerance, polydipsia, polyphagia and polyuria.  Genitourinary: Negative for dysuria, urgency, frequency and testicular pain.       End-stage renal disease. Now off dialysis per patient choice.  Musculoskeletal: Negative for myalgias, back pain, arthralgias, gait problem and neck pain.  Skin: Negative for color change, pallor and rash.  Allergic/Immunologic: Negative.   Neurological: Positive for tremors (Mild jerking of both arms.). Negative for dizziness, syncope, speech difficulty, weakness, numbness and headaches.       Dementia. Likely Alzheimer's disease.  Hematological: Negative for adenopathy. Does not bruise/bleed easily.  Psychiatric/Behavioral: Positive for hallucinations, confusion and sleep disturbance. Negative for behavioral problems and decreased concentration. The patient is not nervous/anxious.      There is no immunization history on file for this patient. Pertinent  Health Maintenance  Due  Topic Date Due  . HEMOGLOBIN A1C  Dec 19, 1940  . FOOT EXAM  12/23/1948  . OPHTHALMOLOGY EXAM  12/23/1948  . URINE MICROALBUMIN  12/23/1948  . INFLUENZA VACCINE  07/07/2016 (Originally 11/09/2015)  . PNA vac Low Risk Adult (1 of 2 - PCV13) 07/07/2016 (Originally 12/24/2003)   No flowsheet data found. Functional Status Survey:    Filed Vitals:   07/28/15 1639  BP: 116/62  Pulse: 70  Temp: 98.1 F (36.7 C)  TempSrc: Oral  Resp: 18  Height:  (1.702 m)  Weight: 123 lb (55.792 kg)   Body mass index is 19.26 kg/(m^2). Physical Exam  Constitutional: He is oriented to person, place, and time. No distress.  Then, frail  HENT:  Right Ear: External ear normal.  Left Ear: External ear normal.  Nose: Nose normal.  Mouth/Throat: Oropharynx is clear and moist. No oropharyngeal exudate.  Eyes: Conjunctivae and EOM are normal. Pupils are equal, round, and reactive to light.  Neck: No JVD present. No tracheal deviation present. No thyromegaly present.  Cardiovascular: Normal rate, regular rhythm, normal heart sounds and intact distal pulses.  Exam reveals no gallop and no friction rub.   No murmur heard. Pulmonary/Chest: No respiratory distress. He has wheezes. He has no rales. He exhibits no tenderness.  Abdominal: Soft. Bowel sounds are normal. He exhibits no distension and no mass. There is no tenderness.  Right inguinal mass measuring 3 x 4 cm. Mildly tender on palpation. Inguinal hernia is reducible, and the mass resolved after gentle pressure for approximately 5 minutes. No other abdominal masses noted.  Musculoskeletal: Normal range of motion. He exhibits no edema or tenderness.  Lymphadenopathy:    He has no cervical adenopathy.  Neurological: He is alert and oriented to person, place, and time. He has normal reflexes. No cranial nerve deficit. Coordination normal.  Mild dementia  Skin: No rash noted. No erythema. No pallor.  Psychiatric: He has a normal mood and affect. His  behavior is normal. Thought content normal.    Labs reviewed:  Recent Labs  04/21/15  NA 145  K 5.2  BUN 50*  CREATININE 3.7*   No results for input(s): AST, ALT, ALKPHOS, BILITOT, PROT, ALBUMIN in the last 8760 hours.  Recent Labs  04/21/15  WBC 9.2  HGB 10.7*  HCT 35*   Lab Results  Component Value Date   TSH 1.430 12/15/2013   No results found for: HGBA1C No results found for: CHOL, HDL, LDLCALC, LDLDIRECT, TRIG, CHOLHDL  Significant Diagnostic Results in last 30 days:  No results found.  Assessment/Plan  Wheeze 07/02/15 CXR no acute cardiopulmonary disease.  Noted mild diffused expiratory wheezes post mid to lower lungs, takes Duoneb tid, Albuterol HFA 2 puffs tid, update CXR in setting of reported hallucination and increased confusion   Tremor No apparent tremor noted, continue Requip  tid.   Anemia of chronic kidney failure 04/21/15 Hgb 10.7, Na 145, K 5.2, Bun 50, creat 3.7   CKD (chronic kidney disease) stage 5, GFR less than 15 ml/min 04/21/15 Hgb 10.7, Na 145, K 5.2, Bun 50, creat 3.7   Compression fracture of L1 lumbar vertebra Prn Norco available to him for pain management.   Dementia Apparently he has increased confusion, reported hallucination, will obtain CXR, UA C/S, CBC, CMP  Diarrhea Controlled, continue Hyoscyamine 0.375mg  q12h, Imodium  qam  Essential hypertension, benign Blood pressure is controlled, continue Atenolol  daily.   Insomnia Stable, continue Sertraline  daily, Lorazepam 0.5mg  tid, Lorazepam gel 0.5mg  prn, Temazepam  qhs  Major depression (HCC) Stable, continue Sertraline  daily, Lorazepam 0.5mg  tid, Lorazepam gel 0.5mg  prn, Temazepam  qhs     Family/ staff Communication: continue SNF for care needs, observe for hallucination and confusion  Labs/tests ordered:  CBC, CMP, UA C/S, CXR

## 2015-08-04 LAB — HEMOGLOBIN A1C: Hemoglobin A1C: 5.9

## 2015-08-05 ENCOUNTER — Ambulatory Visit (INDEPENDENT_AMBULATORY_CARE_PROVIDER_SITE_OTHER): Admitting: Gastroenterology

## 2015-08-05 ENCOUNTER — Encounter: Payer: Self-pay | Admitting: Gastroenterology

## 2015-08-05 VITALS — BP 108/50 | HR 70 | Ht 66.0 in

## 2015-08-05 DIAGNOSIS — K920 Hematemesis: Secondary | ICD-10-CM | POA: Diagnosis not present

## 2015-08-05 DIAGNOSIS — R109 Unspecified abdominal pain: Secondary | ICD-10-CM

## 2015-08-05 NOTE — Patient Instructions (Addendum)
Increase the Omeprazole 20 mg to 1 tab by mouth twice daily. [facility]. 1. CBC w/ Diff 2. HPylori  IgG  Lab orders provided.   You have been scheduled for an endoscopy with [facility] Amada JupiterHenry Danis [facility]. Date is 08-25-2015. Please follow written instructions given to you at your visit today. I

## 2015-08-05 NOTE — Progress Notes (Signed)
08/05/2015 Charles [name] [id number] [birth year]   HISTORY OF PRESENT ILLNESS:  This is a 75 year old male who resides at [facility] due to dementia and issues with incontinence that his daughter was unable to continue to care for at their home. Past medical history includes emphysema for which he is not on oxygen, chronic kidney failure for which he was on dialysis previously but has not been on that for approximately the past year, dementia, hypertension.  He is here today with his daughter at the request of his PCP, Dr. Chilton SiGreen, for reports of coffee-ground emesis. His daughter says that apparently Monday night he vomited a small amount of dark material and then Tuesday morning had another episode where he vomited a much larger amount of dark-colored material. She also states that he has been complaining of his stomach hurting for a while. His appetite has apparently been good recently. There is not a lot of details surrounding this and the patient is unable to provide much information. He is on omeprazole 20 mg daily. Does not receive any form of NSAIDs. He is chronically anemic with a hemoglobin of 9.8 g on April 24 as compared to 10.7 g in January. His MCV is actually elevated at 102.5.   Past Medical History  Diagnosis Date  . Hypertension   . Hyperlipidemia   . Emphysema lung (HCC)   . Heart failure (HCC)   . 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 Anemia 11/2013  . Inguinal hernia 07/19/2015   Past Surgical History  Procedure Laterality Date  . Cataract extraction    . Renal [facility], [facility]    [facility]  . Insertion of dialysis catheter N/A 12/17/2013    Procedure: INSERTION OF DIALYSIS CATHETER RIGHT INTERNAL JUGULAR VEIN;  Surgeon: Pryor OchoaJames D Lawson, MD;  [facility];  Service: Vascular;  Laterality: N/A;    reports that he has quit smoking. He has quit using smokeless tobacco. He reports that he does not drink alcohol or use illicit  drugs. family history is not on file. Allergies  Allergen Reactions  . Influenza Vaccines       Outpatient Encounter Prescriptions as of 08/05/2015  Medication Sig  . acetaminophen (TYLENOL) 325 MG tablet Take by mouth every 4 (four) hours as needed.  Marland Kitchen. albuterol (PROVENTIL HFA;VENTOLIN HFA) 108 (90 BASE) MCG/ACT inhaler Inhale 2 puffs into the lungs 3 (three) times daily.  . AMBULATORY NON FORMULARY MEDICATION Lorazepam 0.5 mg/ml Gel   Apply 1 ml= 0.5mg  topically to skin every 4 hours as needed for anxiety. (Patient taking differently: Lorazepam 0.5 mg/ml Gel   Apply 1 ml= 0.5mg  topically to skin every 6 hours as needed for anxiety.)  . atenolol (TENORMIN) 25 MG tablet Take 25 mg by mouth daily.  . hyoscyamine (LEVBID) 0.375 MG 12 hr tablet 1 every 12 hours to reduce abdominal discomfort  . ipratropium-albuterol (DUONEB) 0.5-2.5 (3) MG/3ML SOLN Take 3 mLs by nebulization 2 (two) times daily. For SOB  . loperamide (IMODIUM A-D) 2 MG tablet 1 each morning to control diarrhea  . LORazepam (ATIVAN) 0.5 MG tablet Take one tablet by mouth three times daily for anxiety  . Nutritional Supplements (FEEDING SUPPLEMENT, NEPRO CARB STEADY,) LIQD Take 237 mLs by mouth 3 (three) times daily between meals.  Marland Kitchen. omeprazole (PRILOSEC) 20 MG capsule Take 20 mg by mouth daily.  Marland Kitchen. oxyCODONE-acetaminophen (PERCOCET) 5-325 mg TABS tablet Take one tablet by mouth every 6 hours  as needed for pain. Do not exceed 4gm of Tylenol in 24 hours  . promethazine (PHENERGAN) 25 MG tablet Take 25 mg by mouth every 6 (six) hours as needed for nausea or vomiting.  Marland Kitchen. rOPINIRole (REQUIP) 1 MG tablet Take 1 mg by mouth. Take one tablet three times daily  . sertraline (ZOLOFT) 100 MG tablet 1 nightly for depression and anxiety  . temazepam (RESTORIL) 15 MG capsule Take one capsule by mouth every night at bedtime for insomnia   No facility-administered encounter medications on file as of 08/05/2015.     REVIEW OF SYSTEMS  :  All other systems reviewed and negative except where noted in the History of Present Illness.   PHYSICAL EXAM: BP 108/50 mmHg  Pulse 70  Ht 5\' 6"  (1.676 m)  Wt  General: Well developed white male in no acute distress; in wheelchair Head: Normocephalic and atraumatic Eyes:  Sclerae anicteric, conjunctiva pink. Ears: Normal auditory acuity Lungs: Clear throughout to auscultation Heart: Regular rate and rhythm Abdomen: Soft, non-distended.  Normal bowel sounds.  Non-tender. Musculoskeletal: Symmetrical with no gross deformities  Skin: No lesions on visible extremities Extremities: No edema  Neurological: Alert oriented x 4, grossly non-focal Psychological:  Alert and cooperative. Normal mood and affect  ASSESSMENT AND PLAN: -75 year old male with reported episodes of coffee-ground emesis. He is here today with his daughter and son-in-law since he is a nursing home resident and has dementia. They would like this evaluated with endoscopy to help try to identify the source. Other than his dementia, chronic kidney disease, and emphysema (not on O2) he does not have a lot of other chronic active medical problems that would prohibit him from having an endoscopy. I informed them that due to his significantly limited mobility that this procedure would need to be done in the outpatient hospital setting and likely would not occur for a couple of weeks. They are agreeable to that and in the interim I am going to increase his omeprazole to twice daily. Going to have them recheck his CBC tomorrow, April 28, and we will check an H. pylori IgG serology as well. Rule out ulcer disease, erosive esophagitis, etc.  Will be scheduled for EGD with Dr. Myrtie Neitheranis.  *The risks, benefits, and alternatives to EGD were discussed with the patient's family and they consent to proceed.    CC:  Kimber RelicGreen, Arthur G, MD

## 2015-08-06 ENCOUNTER — Non-Acute Institutional Stay (SKILLED_NURSING_FACILITY): Payer: Medicare Other | Admitting: Internal Medicine

## 2015-08-06 ENCOUNTER — Encounter: Payer: Self-pay | Admitting: Internal Medicine

## 2015-08-06 DIAGNOSIS — R110 Nausea: Secondary | ICD-10-CM | POA: Diagnosis not present

## 2015-08-06 DIAGNOSIS — K920 Hematemesis: Secondary | ICD-10-CM | POA: Diagnosis not present

## 2015-08-06 DIAGNOSIS — R531 Weakness: Secondary | ICD-10-CM | POA: Insufficient documentation

## 2015-08-06 LAB — CBC AND DIFFERENTIAL
HEMATOCRIT: 29 % — AB (ref 41–53)
HEMOGLOBIN: 9.1 g/dL — AB (ref 13.5–17.5)
Platelets: 191 10*3/uL (ref 150–399)
WBC: 9.9 10*3/mL

## 2015-08-06 NOTE — Progress Notes (Signed)
Location:  [facility] Room Number: 207-B Place of Service:  SNF (31) Provider;Marianny Goris, [name], MD  Patient Care Team: Kimber RelicArthur G Daliana Leverett, MD as PCP - General (Internal Medicine)  Extended Emergency Contact Information Primary Emergency Contact: Feliberto HartsBurnette,Shelley  [place] Home Phone: [phone] Relation: Daughter  Code Status:  DNR, Hospice enrolled. Goals of care: Advanced Directive information Advanced Directives 08/06/2015  Does patient have an advance directive? Yes  Type of Advance Directive Out of facility DNR (pink MOST or yellow form)  Does patient want to make changes to advanced directive? No - Patient declined  Copy of advanced directive(s) in chart? Yes  Pre-existing out of facility DNR order (yellow form or pink MOST form) Yellow form placed in chart (order not valid for inpatient use)     Chief Complaint  Patient presents with  . Acute Visit    HPI:  Pt is a 75 y.o. male seen today for an acute visit Following episodes of hematemesis. Patient was [facility] on 08/05/2015 and is now scheduled to have EGD on 08/25/2015. Hematemesis has subsided. Recommendations from gastroenterologist were to increase omeprazole 20 mg twice daily. Patient says his nausea is better, abdominal pain is better, and previous abdominal discomfort is better. Overall, he looks like he is much more comfortable as compared to previous visits. He is still not eating much. He says that he just doesn't care much now. He denies any abdominal pain that would be causing decrease in appetite.  Previous problems with loose stools seem to be improved also.  Hemoglobin today was 9.1 g percent with an MCV of 101. Hemoglobin last week was 9.8 g percent   Past Medical History  Diagnosis Date  . Hypertension   . Hyperlipidemia   . Emphysema lung (HCC)   . Heart failure (HCC)   . 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 Anemia 11/2013    . Inguinal hernia 07/19/2015   Past Surgical History  Procedure Laterality Date  . Cataract extraction    . [facility]    [facility]  . Insertion of dialysis catheter N/A 12/17/2013    Procedure: INSERTION OF DIALYSIS CATHETER RIGHT INTERNAL JUGULAR VEIN;  Surgeon: Pryor OchoaJames D Lawson, MD;  [facility];  Service: Vascular;  Laterality: N/A;    Allergies  Allergen Reactions  . Influenza Vaccines       Medication List       This list is accurate as of: 08/06/15  2:32 PM.  Always use your most recent med list.               acetaminophen 325 MG tablet  Commonly known as:  TYLENOL  Take by mouth every 4 (four) hours as needed.     albuterol 108 (90 Base) MCG/ACT inhaler  Commonly known as:  PROVENTIL HFA;VENTOLIN HFA  Inhale 2 puffs into the lungs 3 (three) times daily.     AMBULATORY NON FORMULARY MEDICATION  Lorazepam 0.5 mg/ml Gel   Apply 1 ml= 0.5mg  topically to skin every 4 hours as needed for anxiety.     atenolol 25 MG tablet  Commonly known as:  TENORMIN  Take 25 mg by mouth daily.     feeding supplement (NEPRO CARB STEADY) Liqd  Take 237 mLs by mouth 3 (three) times daily between meals.     hyoscyamine 0.375 MG 12 hr tablet  Commonly known as:  LEVBID  1 every 12  hours to reduce abdominal discomfort     ipratropium-albuterol 0.5-2.5 (3) MG/3ML Soln  Commonly known as:  DUONEB  Take 3 mLs by nebulization 2 (two) times daily. For SOB     loperamide 2 MG tablet  Commonly known as:  IMODIUM A-D  1 each morning to control diarrhea     omeprazole 20 MG capsule  Commonly known as:  PRILOSEC  Take 20 mg by mouth 2 (two) times daily before a meal.     oxyCODONE-acetaminophen 5-325 mg Tabs tablet  Commonly known as:  PERCOCET  Take one tablet by mouth every 6 hours as needed for pain. Do not exceed 4gm of Tylenol in 24 hours     promethazine 25 MG tablet  Commonly known as:  PHENERGAN  Take 25 mg by mouth every 6 (six) hours as needed for  nausea or vomiting.     rOPINIRole 1 MG tablet  Commonly known as:  REQUIP  Take 1 mg by mouth. Take one tablet three times daily     sertraline 100 MG tablet  Commonly known as:  ZOLOFT  1 nightly for depression and anxiety     temazepam 15 MG capsule  Commonly known as:  RESTORIL  Take one capsule by mouth every night at bedtime for insomnia        Review of Systems  Constitutional: Positive for activity change, appetite change and fatigue. Negative for fever and unexpected weight change.       Losing weight  HENT: Negative for congestion, ear pain, hearing loss, rhinorrhea, sore throat, tinnitus, trouble swallowing and voice change.   Eyes:       Corrective lenses  Respiratory: Negative for cough, choking, chest tightness, shortness of breath and wheezing.   Cardiovascular: Negative for chest pain, palpitations and leg swelling.  Gastrointestinal: Negative for nausea, abdominal pain, diarrhea, constipation and abdominal distention.       Mildly tender right inguinal mass measuring about 3 x 4" diameter.   Endocrine: Negative for cold intolerance, heat intolerance, polydipsia, polyphagia and polyuria.  Genitourinary: Negative for dysuria, urgency, frequency and testicular pain.       End-stage renal disease. Now off dialysis per patient choice.  Musculoskeletal: Negative for myalgias, back pain, arthralgias, gait problem and neck pain.  Skin: Negative for color change, pallor and rash.  Allergic/Immunologic: Negative.   Neurological: Positive for tremors (Mild jerking of both arms.). Negative for dizziness, syncope, speech difficulty, weakness, numbness and headaches.       Dementia. Likely Alzheimer's disease.  Hematological: Negative for adenopathy. Does not bruise/bleed easily.       History of anemia  Psychiatric/Behavioral: Positive for sleep disturbance. Negative for hallucinations, behavioral problems and decreased concentration. The patient is not nervous/anxious.       There is no immunization history on file for this patient. Pertinent  Health Maintenance Due  Topic Date Due  . HEMOGLOBIN A1C  [birth year]  . FOOT EXAM  12/23/1948  . OPHTHALMOLOGY EXAM  12/23/1948  . URINE MICROALBUMIN  12/23/1948  . INFLUENZA VACCINE  07/07/2016 (Originally 11/09/2015)  . PNA vac Low Risk Adult (1 of 2 - PCV13) 07/07/2016 (Originally 12/24/2003)   No flowsheet data found. Functional Status Survey:    Filed Vitals:   08/06/15 1420  BP: 114/61  Pulse: 77  Temp: 99.2 F (37.3 C)  TempSrc: Oral  Resp: 17   There is no weight on file to calculate BMI. Physical Exam  Constitutional: He is oriented to person, place, and  time. No distress.  Then, frail  HENT:  Right Ear: External ear normal.  Left Ear: External ear normal.  Nose: Nose normal.  Mouth/Throat: Oropharynx is clear and moist. No oropharyngeal exudate.  Eyes: Conjunctivae and EOM are normal. Pupils are equal, round, and reactive to light.  Neck: No JVD present. No tracheal deviation present. No thyromegaly present.  Cardiovascular: Normal rate, regular rhythm, normal heart sounds and intact distal pulses.  Exam reveals no gallop and no friction rub.   No murmur heard. Pulmonary/Chest: No respiratory distress. He has no wheezes. He has no rales. He exhibits no tenderness.  Abdominal: Soft. Bowel sounds are normal. He exhibits no distension and no mass. There is no tenderness.  Prior right inguinal mass measuring 3 x 4 cm.is reduced. Inguinal hernia was reducible previously, and the mass resolved after gentle pressure for approximately 5 minutes. No other abdominal masses noted.  Musculoskeletal: Normal range of motion. He exhibits no edema or tenderness.  Lymphadenopathy:    He has no cervical adenopathy.  Neurological: He is alert and oriented to person, place, and time. He has normal reflexes. No cranial nerve deficit. Coordination normal.  Mild dementia  Skin: No rash noted. No erythema. No  pallor.  Psychiatric: He has a normal mood and affect. His behavior is normal. Thought content normal.    Labs reviewed:  Recent Labs  04/21/15  NA 145  K 5.2  BUN 50*  CREATININE 3.7*   No results for input(s): AST, ALT, ALKPHOS, BILITOT, PROT, ALBUMIN in the last 8760 hours.  Recent Labs  04/21/15  WBC 9.2  HGB 10.7*  HCT 35*   Lab Results  Component Value Date   TSH 1.430 12/15/2013   No results found for: HGBA1C No results found for: CHOL, HDL, LDLCALC, LDLDIRECT, TRIG, CHOLHDL  Significant Diagnostic Results in last 30 days:  No results found.  Assessment/Plan  1. Hematemesis with nausea Occurred 2 days ago. Symptoms resolved. Patient is more comfortable with less abdominal distress. Hemoglobin is dropping. Patient has seen gastroenterologist at [facility]. EGD scheduled for 08/25/2015. -CBC, reticulocyte count next week -Ferrous sulfate 325 mg daily -I encouraged patient follow through with EGD as planned

## 2015-08-06 NOTE — Progress Notes (Signed)
Thank you for sending this case to me. I have reviewed the entire note, and the outlined plan seems appropriate.  That was the plan we discussed during the patient's office visit.

## 2015-08-17 ENCOUNTER — Encounter (HOSPITAL_COMMUNITY): Payer: Self-pay | Admitting: *Deleted

## 2015-08-17 NOTE — Progress Notes (Addendum)
Preop instructions for: Charles [name]                        Date of Birth       1941-01-12                     Date of Procedure:  08-25-2015    Doctor: Myrtie Neitheranis [facility] to [facility] at [facility]:1200 Noon Report to: Admitting Procedure time:1330-1415  Procedure:Esophagogastroduodenoscopy Any procedure time changes, MD office will notify you!   Do not eat or drink past midnight the night before your procedure.(To include any tube feedings-must be discontinued) Reminder:Follow bowel prep instructions per MD office!   Take these morning medications only with sips of water.(or give through gastrostomy or feeding tube): Atenolol. Sertraline.  Note: No Insulin or Diabetic meds should be given or taken the morning of the procedure!   Facility contact: [facility] [address]  " Charles [name]"/ "Charles [name]"               Phone:  [phone]                Health Care POA: Charles [name] -daughter Family contact : Charles [name] -daughter [phone](will be present) to consent for patient. Transportation contact phone#: [facility] [phone]  Please send day of procedure:current med list and meds last date/time taken that day, confirm nothing by mouth status from what time. Patient Demographic info( to include DNR status, problem list, allergies)   RN contact name/phone#:"Charles [name]"/ " Charles [name]"     [phone]                      and Fax #: [phone]  Bring Insurance card and picture ID Leave all jewelry and other valuables at place where living( no metal or rings to be worn) No contact lens Women-no make-up, no lotions,perfumes,powders Men-no colognes,lotions  Any questions day of procedure,call Endoscopy [phone]!   Sent from :[facility]                   Phone:[phone]                   Fax:[phone]  Sent by :RN Charles PaisWilhemina Leonardo Plaia,RN - 08-19-15 (Final pre procedure instructions_____

## 2015-08-19 ENCOUNTER — Encounter (HOSPITAL_COMMUNITY): Payer: Self-pay | Admitting: *Deleted

## 2015-08-25 ENCOUNTER — Ambulatory Visit (HOSPITAL_COMMUNITY): Admitting: Certified Registered Nurse Anesthetist

## 2015-08-25 ENCOUNTER — Ambulatory Visit (HOSPITAL_COMMUNITY)
Admission: RE | Admit: 2015-08-25 | Discharge: 2015-08-25 | Disposition: A | Discharge status: Skilled Nursing Facility | Source: Ambulatory Visit | Attending: Gastroenterology | Admitting: Gastroenterology

## 2015-08-25 ENCOUNTER — Encounter (HOSPITAL_COMMUNITY): Payer: Self-pay | Admitting: Certified Registered Nurse Anesthetist

## 2015-08-25 ENCOUNTER — Encounter (HOSPITAL_COMMUNITY)
Admission: RE | Disposition: A | Discharge status: Skilled Nursing Facility | Payer: Self-pay | Source: Ambulatory Visit | Attending: Gastroenterology

## 2015-08-25 DIAGNOSIS — R1110 Vomiting, unspecified: Secondary | ICD-10-CM | POA: Insufficient documentation

## 2015-08-25 DIAGNOSIS — I130 Hypertensive heart and chronic kidney disease with heart failure and stage 1 through stage 4 chronic kidney disease, or unspecified chronic kidney disease: Secondary | ICD-10-CM | POA: Diagnosis not present

## 2015-08-25 DIAGNOSIS — R109 Unspecified abdominal pain: Secondary | ICD-10-CM

## 2015-08-25 DIAGNOSIS — R1013 Epigastric pain: Secondary | ICD-10-CM | POA: Diagnosis not present

## 2015-08-25 DIAGNOSIS — N186 End stage renal disease: Secondary | ICD-10-CM | POA: Diagnosis not present

## 2015-08-25 DIAGNOSIS — D649 Anemia, unspecified: Secondary | ICD-10-CM | POA: Diagnosis not present

## 2015-08-25 DIAGNOSIS — I509 Heart failure, unspecified: Secondary | ICD-10-CM | POA: Insufficient documentation

## 2015-08-25 DIAGNOSIS — F0280 Dementia in other diseases classified elsewhere without behavioral disturbance: Secondary | ICD-10-CM | POA: Insufficient documentation

## 2015-08-25 DIAGNOSIS — K920 Hematemesis: Secondary | ICD-10-CM | POA: Diagnosis not present

## 2015-08-25 DIAGNOSIS — G309 Alzheimer's disease, unspecified: Secondary | ICD-10-CM | POA: Insufficient documentation

## 2015-08-25 DIAGNOSIS — K219 Gastro-esophageal reflux disease without esophagitis: Secondary | ICD-10-CM | POA: Diagnosis not present

## 2015-08-25 DIAGNOSIS — Z87891 Personal history of nicotine dependence: Secondary | ICD-10-CM | POA: Insufficient documentation

## 2015-08-25 DIAGNOSIS — M3214 Glomerular disease in systemic lupus erythematosus: Secondary | ICD-10-CM | POA: Insufficient documentation

## 2015-08-25 DIAGNOSIS — Z79899 Other long term (current) drug therapy: Secondary | ICD-10-CM | POA: Insufficient documentation

## 2015-08-25 DIAGNOSIS — E1122 Type 2 diabetes mellitus with diabetic chronic kidney disease: Secondary | ICD-10-CM | POA: Insufficient documentation

## 2015-08-25 DIAGNOSIS — Z79891 Long term (current) use of opiate analgesic: Secondary | ICD-10-CM | POA: Insufficient documentation

## 2015-08-25 DIAGNOSIS — J439 Emphysema, unspecified: Secondary | ICD-10-CM | POA: Insufficient documentation

## 2015-08-25 DIAGNOSIS — F329 Major depressive disorder, single episode, unspecified: Secondary | ICD-10-CM | POA: Diagnosis not present

## 2015-08-25 HISTORY — DX: Unspecified urinary incontinence: R32

## 2015-08-25 HISTORY — DX: Other reduced mobility: Z74.09

## 2015-08-25 HISTORY — DX: Unspecified dementia, unspecified severity, without behavioral disturbance, psychotic disturbance, mood disturbance, and anxiety: F03.90

## 2015-08-25 HISTORY — DX: Unspecified injury of shoulder and upper arm, unspecified arm, initial encounter: S49.90XA

## 2015-08-25 HISTORY — PX: ESOPHAGOGASTRODUODENOSCOPY (EGD) WITH PROPOFOL: SHX5813

## 2015-08-25 LAB — POCT I-STAT 4, (NA,K, GLUC, HGB,HCT)
Glucose, Bld: 84 mg/dL (ref 65–99)
HCT: 32 % — ABNORMAL LOW (ref 39.0–52.0)
Hemoglobin: 10.9 g/dL — ABNORMAL LOW (ref 13.0–17.0)
POTASSIUM: 5.6 mmol/L — AB (ref 3.5–5.1)
SODIUM: 144 mmol/L (ref 135–145)

## 2015-08-25 SURGERY — ESOPHAGOGASTRODUODENOSCOPY (EGD) WITH PROPOFOL
Anesthesia: Monitor Anesthesia Care

## 2015-08-25 MED ORDER — PROPOFOL 10 MG/ML IV BOLUS
INTRAVENOUS | Status: AC
  Filled 2015-08-25: qty 40

## 2015-08-25 MED ORDER — SODIUM CHLORIDE 0.9 % IV SOLN
INTRAVENOUS | Status: DC
  Administered 2015-08-25: 500 mL via INTRAVENOUS

## 2015-08-25 MED ORDER — PROPOFOL 500 MG/50ML IV EMUL
INTRAVENOUS | Status: DC | PRN
  Administered 2015-08-25: 100 ug/kg/min via INTRAVENOUS

## 2015-08-25 MED ORDER — PROPOFOL 500 MG/50ML IV EMUL
INTRAVENOUS | Status: DC | PRN
  Administered 2015-08-25 (×2): 30 mg via INTRAVENOUS

## 2015-08-25 NOTE — Op Note (Signed)
[facility] Patient Name: Charles SchoolJames Seifer Procedure Date: 08/25/2015 MRN: [id number] Attending MD: Starr LakeHenry L. Myrtie Neitheranis , MD Date of Birth: October 22, 1940 CSN: [id number] Age: [age] Admit Type: Outpatient Procedure:                Upper GI endoscopy Indications:              Epigastric abdominal pain, Hematemesis Providers:                Sherilyn CooterHenry L. Myrtie Neitheranis, MD, Anthony Saraniel Madden, RN, Jacquiline DoeJennifer                            Zhu, RN, Clearnce SorrelKatie Smith, Technician, Mirian MoKelley Carver,                            CRNA Referring MD:              Medicines:                Monitored Anesthesia Care Complications:            No immediate complications. Estimated Blood Loss:     Estimated blood loss: none. Procedure:                Pre-Anesthesia Assessment:                           - Prior to the procedure, a History and Physical                            was performed, and patient medications and                            allergies were reviewed. The patient's tolerance of                            previous anesthesia was also reviewed. The risks                            and benefits of the procedure and the sedation                            options and risks were discussed with the patient.                            All questions were answered, and informed consent                            was obtained. Prior Anticoagulants: The patient has                            taken no previous anticoagulant or antiplatelet                            agents. ASA Grade Assessment: III - A patient with  severe systemic disease. After reviewing the risks                            and benefits, the patient was deemed in                            satisfactory condition to undergo the procedure.                           After obtaining informed consent, the endoscope was                            passed under direct vision. Throughout the                            procedure, the  patient's blood pressure, pulse, and                            oxygen saturations were monitored continuously. The                            [id number] ([id number]) scope was introduced through the                            mouth, and advanced to the duodenal bulb. The upper                            GI endoscopy was accomplished without difficulty.                            The patient tolerated the procedure well. Scope In: Scope Out: Findings:      The esophagus was normal.      The stomach was normal.      The duodenal bulb was normal. The scope could not be advanced to the       more distal duodenum due to a J-shaped stomach.      The cardia and gastric fundus were normal on retroflexion. Impression:               - Normal esophagus.                           - Normal stomach.                           - Normal duodenal bulb.                           - No specimens collected. Moderate Sedation:      MAC sedation used Recommendation:           - Patient has a contact number available for                            emergencies. The signs and symptoms of potential  delayed complications were discussed with the                            patient. Return to normal activities tomorrow.                            Written discharge instructions were provided to the                            patient.                           - Resume previous diet.                           Keep head of bed elevated to about 30 degrees at                            all times to decrease GERD.                           - Continue present medications except hyoscyamine,                            which can cause anti-cholinergic side effects in                            elderly patients with dementia.                           Continue twice daily omeprazole Procedure Code(s):        --- Professional ---                           [phone], Esophagogastroduodenoscopy, flexible,                             transoral; diagnostic, including collection of                            specimen(s) by brushing or washing, when performed                            (separate procedure) Diagnosis Code(s):        --- Professional ---                           R10.13, Epigastric pain                           K92.0, Hematemesis CPT copyright 2016 American Medical Association. All rights reserved. The codes documented in this report are preliminary and upon coder review may  be revised to meet current compliance requirements. Anjelique Makar L. Myrtie Neither, MD 08/25/2015 2:16:15 PM This report has been signed electronically. Number of Addenda: 0

## 2015-08-25 NOTE — H&P (View-Only) (Signed)
08/05/2015 Charles [name] [id number] [birth year]   HISTORY OF PRESENT ILLNESS:  This is a 75 year old male who resides at [facility] due to dementia and issues with incontinence that his daughter was unable to continue to care for at their home. Past medical history includes emphysema for which he is not on oxygen, chronic kidney failure for which he was on dialysis previously but has not been on that for approximately the past year, dementia, hypertension.  He is here today with his daughter at the request of his PCP, Dr. Chilton Si, for reports of coffee-ground emesis. His daughter says that apparently Monday night he vomited a small amount of dark material and then Tuesday morning had another episode where he vomited a much larger amount of dark-colored material. She also states that he has been complaining of his stomach hurting for a while. His appetite has apparently been good recently. There is not a lot of details surrounding this and the patient is unable to provide much information. He is on omeprazole 20 mg daily. Does not receive any form of NSAIDs. He is chronically anemic with a hemoglobin of 9.8 g on April 24 as compared to 10.7 g in January. His MCV is actually elevated at 102.5.   Past Medical History  Diagnosis Date  . Hypertension   . Hyperlipidemia   . Emphysema lung (HCC)   . Heart failure (HCC)   . 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 Anemia 11/2013  . Inguinal hernia 07/19/2015   Past Surgical History  Procedure Laterality Date  . Cataract extraction    . Renal biopsy, percutaneous  11/2013    at [facility]  . Insertion of dialysis catheter N/A 12/17/2013    Procedure: INSERTION OF DIALYSIS CATHETER RIGHT INTERNAL JUGULAR VEIN;  Surgeon: Pryor Ochoa, MD;  Location: [facility] OR;  Service: Vascular;  Laterality: N/A;    reports that he has quit smoking. He has quit using smokeless tobacco. He reports that he does not drink alcohol or use illicit  drugs. family history is not on file. Allergies  Allergen Reactions  . Influenza Vaccines       Outpatient Encounter Prescriptions as of 08/05/2015  Medication Sig  . acetaminophen (TYLENOL) 325 MG tablet Take by mouth every 4 (four) hours as needed.  Marland Kitchen albuterol (PROVENTIL HFA;VENTOLIN HFA) 108 (90 BASE) MCG/ACT inhaler Inhale 2 puffs into the lungs 3 (three) times daily.  . AMBULATORY NON FORMULARY MEDICATION Lorazepam 0.5 mg/ml Gel   Apply 1 ml= 0.5mg  topically to skin every 4 hours as needed for anxiety. (Patient taking differently: Lorazepam 0.5 mg/ml Gel   Apply 1 ml= 0.5mg  topically to skin every 6 hours as needed for anxiety.)  . atenolol (TENORMIN) 25 MG tablet Take 25 mg by mouth daily.  . hyoscyamine (LEVBID) 0.375 MG 12 hr tablet 1 every 12 hours to reduce abdominal discomfort  . ipratropium-albuterol (DUONEB) 0.5-2.5 (3) MG/3ML SOLN Take 3 mLs by nebulization 2 (two) times daily. For SOB  . loperamide (IMODIUM A-D) 2 MG tablet 1 each morning to control diarrhea  . LORazepam (ATIVAN) 0.5 MG tablet Take one tablet by mouth three times daily for anxiety  . Nutritional Supplements (FEEDING SUPPLEMENT, NEPRO CARB STEADY,) LIQD Take 237 mLs by mouth 3 (three) times daily between meals.  Marland Kitchen omeprazole (PRILOSEC) 20 MG capsule Take 20 mg by mouth daily.  Marland Kitchen oxyCODONE-acetaminophen (PERCOCET) 5-325 mg TABS tablet Take one tablet by mouth every 6 hours  as needed for pain. Do not exceed 4gm of Tylenol in 24 hours  . promethazine (PHENERGAN) 25 MG tablet Take 25 mg by mouth every 6 (six) hours as needed for nausea or vomiting.  Marland Kitchen. rOPINIRole (REQUIP) 1 MG tablet Take 1 mg by mouth. Take one tablet three times daily  . sertraline (ZOLOFT) 100 MG tablet 1 nightly for depression and anxiety  . temazepam (RESTORIL) 15 MG capsule Take one capsule by mouth every night at bedtime for insomnia   No facility-administered encounter medications on file as of 08/05/2015.     REVIEW OF SYSTEMS  :  All other systems reviewed and negative except where noted in the History of Present Illness.   PHYSICAL EXAM: BP 108/50 mmHg  Pulse 70  Ht 5\' 6"  (1.676 m)  Wt  General: Well developed white male in no acute distress; in wheelchair Head: Normocephalic and atraumatic Eyes:  Sclerae anicteric, conjunctiva pink. Ears: Normal auditory acuity Lungs: Clear throughout to auscultation Heart: Regular rate and rhythm Abdomen: Soft, non-distended.  Normal bowel sounds.  Non-tender. Musculoskeletal: Symmetrical with no gross deformities  Skin: No lesions on visible extremities Extremities: No edema  Neurological: Alert oriented x 4, grossly non-focal Psychological:  Alert and cooperative. Normal mood and affect  ASSESSMENT AND PLAN: -75 year old male with reported episodes of coffee-ground emesis. He is here today with his daughter and son-in-law since he is a nursing home resident and has dementia. They would like this evaluated with endoscopy to help try to identify the source. Other than his dementia, chronic kidney disease, and emphysema (not on O2) he does not have a lot of other chronic active medical problems that would prohibit him from having an endoscopy. I informed them that due to his significantly limited mobility that this procedure would need to be done in the outpatient hospital setting and likely would not occur for a couple of weeks. They are agreeable to that and in the interim I am going to increase his omeprazole to twice daily. Going to have them recheck his CBC tomorrow, April 28, and we will check an H. pylori IgG serology as well. Rule out ulcer disease, erosive esophagitis, etc.  Will be scheduled for EGD with Dr. Myrtie Neitheranis.  *The risks, benefits, and alternatives to EGD were discussed with the patient's family and they consent to proceed.    CC:  Kimber RelicGreen, Arthur G, MD

## 2015-08-25 NOTE — Transfer of Care (Signed)
Immediate Anesthesia Transfer of Care Note  Patient: Charles GurneyJames A [name]  Procedure(s) Performed: Procedure(s): ESOPHAGOGASTRODUODENOSCOPY (EGD) WITH PROPOFOL (N/A)  Patient Location: PACU  Anesthesia Type:MAC  Level of Consciousness: sedated, patient cooperative and responds to stimulation  Airway & Oxygen Therapy: Patient Spontanous Breathing and Patient connected to nasal cannula oxygen  Post-op Assessment: Report given to RN and Post -op Vital signs reviewed and stable  Post vital signs: Reviewed and stable  Last Vitals:  Filed Vitals:   08/25/15 1241  BP: 131/64  Pulse: 60  Temp: 36.7 C  Resp: 12    Last Pain: There were no vitals filed for this visit.       Complications: No apparent anesthesia complications

## 2015-08-25 NOTE — Anesthesia Postprocedure Evaluation (Signed)
Anesthesia Post Note  Patient: Charles [name]  Procedure(s) Performed: Procedure(s) (LRB): ESOPHAGOGASTRODUODENOSCOPY (EGD) WITH PROPOFOL (N/A)  Patient location during evaluation: Endoscopy Anesthesia Type: MAC Level of consciousness: awake and alert Pain management: pain level controlled Vital Signs Assessment: post-procedure vital signs reviewed and stable Respiratory status: spontaneous breathing, nonlabored ventilation, respiratory function stable and patient connected to nasal cannula oxygen Cardiovascular status: stable and blood pressure returned to baseline Anesthetic complications: no    Last Vitals:  Filed Vitals:   08/25/15 1440 08/25/15 1450  BP: 156/63 140/60  Pulse: 56 55  Temp:    Resp: 20 18    Last Pain: There were no vitals filed for this visit.               Cecile HearingStephen Edward Amanie Mcculley

## 2015-08-25 NOTE — Interval H&P Note (Signed)
History and Physical Interval Note:  08/25/2015 1:33 PM  Charles [name]  has presented today for surgery, with the diagnosis of Coffee ground emesis, abd pain  The various methods of treatment have been discussed with the patient and family. After consideration of risks, benefits and other options for treatment, the patient has consented to  Procedure(s): ESOPHAGOGASTRODUODENOSCOPY (EGD) WITH PROPOFOL (N/A) as a surgical intervention .  The patient's history has been reviewed, patient examined, no change in status, stable for surgery.  I have reviewed the patient's chart and labs.  Questions were answered to the patient's satisfaction.     Charlie PitterHenry L Danis III

## 2015-08-25 NOTE — Anesthesia Preprocedure Evaluation (Addendum)
Anesthesia Evaluation  Patient identified by MRN, date of birth, ID band Patient awake    Reviewed: Allergy & Precautions, NPO status , Patient's Chart, lab work & pertinent test results, reviewed documented beta blocker date and time   Airway Mallampati: II  TM Distance: >3 FB Neck ROM: Full    Dental  (+) Dental Advisory Given, Edentulous Upper, Edentulous Lower   Pulmonary COPD,  COPD inhaler, former smoker,    Pulmonary exam normal breath sounds clear to auscultation       Cardiovascular hypertension, Pt. on home beta blockers and Pt. on medications +CHF  Normal cardiovascular exam Rhythm:Regular Rate:Normal  Echo 2007: SUMMARY - Overall left ventricular systolic function was normal. Left ventricular ejection fraction was estimated , range being 55% to 60 %. There were no left ventricular regional wall motion abnormalities. Left ventricular wall thickness was mildly increased.   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 Now off dialysis per patient choice.)Lupus nephritis   Incontinence     Musculoskeletal negative musculoskeletal ROS (+)   Abdominal   Peds  Hematology  (+) Blood dyscrasia, anemia ,   Anesthesia Other Findings Day of surgery medications reviewed with the patient.  Reproductive/Obstetrics                        Anesthesia Physical Anesthesia Plan  ASA: III  Anesthesia Plan: MAC   Post-op Pain Management:    Induction: Intravenous  Airway Management Planned: Nasal Cannula  Additional Equipment:   Intra-op Plan:   Post-operative Plan:   Informed Consent: I have reviewed the patients History and Physical, chart, labs and discussed the procedure  including the risks, benefits and alternatives for the proposed anesthesia with the patient or authorized representative who has indicated his/her understanding and acceptance.   Dental advisory given  Plan Discussed with: CRNA and Anesthesiologist  Anesthesia Plan Comments: (Discussed risks/benefits/alternatives to MAC sedation including need for ventilatory support, hypotension, need for conversion to general anesthesia.  All patient/guardian questions answered.  Patient/guardian wishes to proceed.)        Anesthesia Quick Evaluation

## 2015-08-25 NOTE — Discharge Instructions (Signed)

## 2015-08-30 ENCOUNTER — Encounter (HOSPITAL_COMMUNITY): Payer: Self-pay | Admitting: Gastroenterology

## 2016-03-10 DEATH — deceased
